# Patient Record
Sex: Male | Born: 1935 | Race: White | Hispanic: No | Marital: Single | Smoking: Former smoker
Health system: Southern US, Community
[De-identification: ages and names within clinical notes are randomized; demographics above are authoritative.]

## PROBLEM LIST (undated history)

## (undated) DIAGNOSIS — N289 Disorder of kidney and ureter, unspecified: Secondary | ICD-10-CM

## (undated) DIAGNOSIS — I10 Essential (primary) hypertension: Secondary | ICD-10-CM

## (undated) DIAGNOSIS — I452 Bifascicular block: Secondary | ICD-10-CM

## (undated) DIAGNOSIS — N189 Chronic kidney disease, unspecified: Secondary | ICD-10-CM

## (undated) DIAGNOSIS — K439 Ventral hernia without obstruction or gangrene: Secondary | ICD-10-CM

## (undated) DIAGNOSIS — K56609 Unspecified intestinal obstruction, unspecified as to partial versus complete obstruction: Secondary | ICD-10-CM

## (undated) DIAGNOSIS — N400 Enlarged prostate without lower urinary tract symptoms: Secondary | ICD-10-CM

## (undated) HISTORY — DX: Disorder of kidney and ureter, unspecified: N28.9

## (undated) HISTORY — DX: Essential (primary) hypertension: I10

## (undated) HISTORY — DX: Chronic kidney disease, unspecified: N18.9

## (undated) HISTORY — PX: APPENDECTOMY: SHX54

## (undated) HISTORY — PX: LAPAROTOMY: SHX154

## (undated) HISTORY — PX: CATARACT EXTRACTION: SUR2

## (undated) HISTORY — DX: Benign prostatic hyperplasia without lower urinary tract symptoms: N40.0

## (undated) HISTORY — DX: Bifascicular block: I45.2

## (undated) HISTORY — DX: Ventral hernia without obstruction or gangrene: K43.9

## (undated) HISTORY — DX: Unspecified intestinal obstruction, unspecified as to partial versus complete obstruction: K56.609

---

## 1998-11-11 ENCOUNTER — Ambulatory Visit (HOSPITAL_COMMUNITY): Admission: RE | Admit: 1998-11-11 | Discharge: 1998-11-11 | Payer: Self-pay | Admitting: *Deleted

## 1998-11-11 ENCOUNTER — Encounter: Payer: Self-pay | Admitting: *Deleted

## 1998-11-25 ENCOUNTER — Encounter: Payer: Self-pay | Admitting: *Deleted

## 1998-11-25 ENCOUNTER — Ambulatory Visit (HOSPITAL_COMMUNITY): Admission: RE | Admit: 1998-11-25 | Discharge: 1998-11-25 | Payer: Self-pay | Admitting: *Deleted

## 1999-01-09 ENCOUNTER — Inpatient Hospital Stay (HOSPITAL_COMMUNITY): Admission: RE | Admit: 1999-01-09 | Discharge: 1999-01-10 | Payer: Self-pay | Admitting: *Deleted

## 2002-05-14 ENCOUNTER — Encounter: Payer: Self-pay | Admitting: *Deleted

## 2002-05-14 ENCOUNTER — Encounter: Admission: RE | Admit: 2002-05-14 | Discharge: 2002-05-14 | Payer: Self-pay | Admitting: *Deleted

## 2002-06-16 ENCOUNTER — Encounter: Admission: RE | Admit: 2002-06-16 | Discharge: 2002-06-16 | Payer: Self-pay | Admitting: *Deleted

## 2002-06-16 ENCOUNTER — Encounter: Payer: Self-pay | Admitting: *Deleted

## 2005-04-03 HISTORY — PX: CARDIOVASCULAR STRESS TEST: SHX262

## 2005-04-06 ENCOUNTER — Encounter: Admission: RE | Admit: 2005-04-06 | Discharge: 2005-04-06 | Payer: Self-pay | Admitting: General Surgery

## 2009-03-07 ENCOUNTER — Encounter: Admission: RE | Admit: 2009-03-07 | Discharge: 2009-03-07 | Payer: Self-pay | Admitting: Cardiology

## 2009-10-10 ENCOUNTER — Ambulatory Visit: Payer: Self-pay | Admitting: Cardiology

## 2009-10-10 ENCOUNTER — Inpatient Hospital Stay (HOSPITAL_COMMUNITY): Admission: EM | Admit: 2009-10-10 | Discharge: 2009-10-13 | Payer: Self-pay | Admitting: Emergency Medicine

## 2009-10-17 ENCOUNTER — Ambulatory Visit: Payer: Self-pay | Admitting: Cardiology

## 2009-11-17 ENCOUNTER — Ambulatory Visit: Payer: Self-pay | Admitting: Cardiology

## 2009-12-19 ENCOUNTER — Ambulatory Visit: Payer: Self-pay | Admitting: Cardiology

## 2010-05-05 LAB — CBC
HCT: 29.3 % — ABNORMAL LOW (ref 39.0–52.0)
Hemoglobin: 9.5 g/dL — ABNORMAL LOW (ref 13.0–17.0)
Hemoglobin: 9.9 g/dL — ABNORMAL LOW (ref 13.0–17.0)
MCH: 31.9 pg (ref 26.0–34.0)
MCH: 32.3 pg (ref 26.0–34.0)
MCHC: 33.5 g/dL (ref 30.0–36.0)
MCV: 95.7 fL (ref 78.0–100.0)
MCV: 96.7 fL (ref 78.0–100.0)
Platelets: 162 10*3/uL (ref 150–400)
Platelets: 206 10*3/uL (ref 150–400)
RBC: 3.05 MIL/uL — ABNORMAL LOW (ref 4.22–5.81)
RBC: 3.06 MIL/uL — ABNORMAL LOW (ref 4.22–5.81)
RDW: 14 % (ref 11.5–15.5)
RDW: 14 % (ref 11.5–15.5)
RDW: 14.3 % (ref 11.5–15.5)
WBC: 6.7 10*3/uL (ref 4.0–10.5)
WBC: 8 10*3/uL (ref 4.0–10.5)

## 2010-05-05 LAB — DIFFERENTIAL
Basophils Absolute: 0 10*3/uL (ref 0.0–0.1)
Basophils Relative: 0 % (ref 0–1)
Eosinophils Absolute: 0 10*3/uL (ref 0.0–0.7)
Lymphocytes Relative: 7 % — ABNORMAL LOW (ref 12–46)
Lymphs Abs: 1 10*3/uL (ref 0.7–4.0)
Neutro Abs: 11.7 10*3/uL — ABNORMAL HIGH (ref 1.7–7.7)
Neutrophils Relative %: 89 % — ABNORMAL HIGH (ref 43–77)

## 2010-05-05 LAB — POCT I-STAT, CHEM 8
BUN: 29 mg/dL — ABNORMAL HIGH (ref 6–23)
Calcium, Ion: 1.12 mmol/L (ref 1.12–1.32)
Chloride: 104 mEq/L (ref 96–112)
Creatinine, Ser: 2.6 mg/dL — ABNORMAL HIGH (ref 0.4–1.5)
Glucose, Bld: 168 mg/dL — ABNORMAL HIGH (ref 70–99)
Potassium: 5.2 mEq/L — ABNORMAL HIGH (ref 3.5–5.1)
TCO2: 28 mmol/L (ref 0–100)

## 2010-05-05 LAB — BASIC METABOLIC PANEL
BUN: 14 mg/dL (ref 6–23)
CO2: 25 mEq/L (ref 19–32)
CO2: 26 mEq/L (ref 19–32)
Calcium: 8.1 mg/dL — ABNORMAL LOW (ref 8.4–10.5)
Calcium: 8.4 mg/dL (ref 8.4–10.5)
GFR calc Af Amer: 35 mL/min — ABNORMAL LOW (ref >60)
GFR calc non Af Amer: 29 mL/min — ABNORMAL LOW (ref >60)
Potassium: 3.7 mEq/L (ref 3.5–5.1)
Potassium: 4.2 mEq/L (ref 3.5–5.1)
Potassium: 4.4 mEq/L (ref 3.5–5.1)
Sodium: 142 mEq/L (ref 135–145)

## 2010-05-05 LAB — IRON AND TIBC
Iron: 28 ug/dL — ABNORMAL LOW (ref 42–135)
Saturation Ratios: 14 % — ABNORMAL LOW (ref 20–55)
TIBC: 206 ug/dL — ABNORMAL LOW (ref 215–435)

## 2010-05-05 LAB — APTT: aPTT: 31 seconds (ref 24–37)

## 2010-05-05 LAB — LIPASE, BLOOD: Lipase: 25 U/L (ref 11–59)

## 2010-05-05 LAB — COMPREHENSIVE METABOLIC PANEL
AST: 28 U/L (ref 0–37)
Albumin: 4.1 g/dL (ref 3.5–5.2)
Alkaline Phosphatase: 50 U/L (ref 39–117)
GFR calc Af Amer: 31 mL/min — ABNORMAL LOW (ref >60)
GFR calc non Af Amer: 25 mL/min — ABNORMAL LOW (ref >60)
Glucose, Bld: 133 mg/dL — ABNORMAL HIGH (ref 70–99)
Sodium: 141 mEq/L (ref 135–145)
Total Bilirubin: 0.8 mg/dL (ref 0.3–1.2)

## 2010-05-05 LAB — RETICULOCYTES: Retic Ct Pct: 1 % (ref 0.4–3.1)

## 2010-05-05 LAB — VITAMIN B12: Vitamin B-12: 118 pg/mL — ABNORMAL LOW (ref 211–911)

## 2010-05-05 LAB — PROTIME-INR: Prothrombin Time: 14.7 seconds (ref 11.6–15.2)

## 2010-05-05 LAB — HEMOCCULT GUIAC POC 1CARD (OFFICE): Fecal Occult Bld: NEGATIVE

## 2010-07-07 NOTE — Op Note (Signed)
[place]  Patient:    Ronald Norris                        MRN: [id number] Proc. Date: 01/09/99 Adm. Date:  [id number] Attending:  Maryanna Shape                           Operative Report  PREOPERATIVE DIAGNOSIS:  Generalized lumbar osteoarthritis with severe stenosis at L4-5, moderate stenosis L3-4.  POSTOPERATIVE DIAGNOSIS:  Generalized lumbar osteoarthritis with severe stenosis at L4-5, moderate stenosis L3-4.  OPERATION:  Decompression of L3-4 and L4-5 exposing the L3 and the L4 nerve roots bilaterally.  SURGEON:  Reynolds Bowl, M.D.  ASSISTANT:  Humberto Leep. Wyonia Hough, M.D.  ANESTHESIA:  General.  DESCRIPTION OF PROCEDURE:  The patient was given a general anesthetic and 1 gm f Ancef was given IV.  He was placed on the operative table using the [facility] frame, prepped and draped in the usual manner.  We identified the L4-5 interspace with  needle markings and x-ray.  A midline incision was made.  The paraspinal muscles were dissected and retracted laterally.  Self-retaining retractor was put in place. Then I removed the posterior spine of L4 and the remainder of the L4 lamina, as  well as the ligamentum flavum at L3-4 and the ligamentum flavum at L4-5.  Then removed in several passes right and left laterally.  Removed the very tight overhanging stenotic walls out to the sidewall.  We exposed the L3 nerve root coming around the pedicle of L3 and at that point also did foraminotomy. Identified the L4 nerve root going around the L4 pedicle and did a foraminotomy. In order to do all of this laterally, this required removal of very thick ligamentum flavum and thick, compressive lateral gutters.  This same procedure as done on the left and the right and at the completion it was felt the nerve roots, foramina, and lateral gutters were free.  The nerve roots were no longer under compression.  The disk spaces had prominent annulus  but no evidence of herniation. Therefore, the annulus was not opened.  Having completed all of this, the area was irrigated several times.  The microscope was removed and the lumbar fascia reapproximated  using figure-of-eight sutures, #1 Vicryl, then 2-0 Vicryl for the subcutaneous tissues, metal staples for the skin, and an air pad Band-Aid type dressing was applied.  The patient returned to the recovery room in good condition. DD:  01/09/99 TD:  01/09/99 Job: [id number]

## 2010-08-16 ENCOUNTER — Other Ambulatory Visit: Payer: Self-pay | Admitting: *Deleted

## 2010-08-17 MED ORDER — METOPROLOL SUCCINATE ER 25 MG PO TB24: 25.0000 mg | ORAL_TABLET | Freq: Two times a day (BID) | ORAL | Status: DC

## 2010-08-17 NOTE — Telephone Encounter (Signed)
Refilled meds per fax request. Spoke with wife and patient says he is taking toprol xl 25 mg twice a day

## 2010-10-06 ENCOUNTER — Other Ambulatory Visit: Payer: Self-pay | Admitting: *Deleted

## 2010-10-06 DIAGNOSIS — I10 Essential (primary) hypertension: Secondary | ICD-10-CM

## 2010-11-10 ENCOUNTER — Encounter: Payer: Self-pay | Admitting: Cardiology

## 2010-11-23 ENCOUNTER — Ambulatory Visit: Payer: Self-pay | Admitting: Cardiology

## 2010-11-23 ENCOUNTER — Other Ambulatory Visit (INDEPENDENT_AMBULATORY_CARE_PROVIDER_SITE_OTHER): Payer: Medicare Other | Admitting: *Deleted

## 2010-11-23 ENCOUNTER — Ambulatory Visit (INDEPENDENT_AMBULATORY_CARE_PROVIDER_SITE_OTHER): Payer: Medicare Other | Admitting: Cardiology

## 2010-11-23 ENCOUNTER — Encounter: Payer: Self-pay | Admitting: Cardiology

## 2010-11-23 ENCOUNTER — Other Ambulatory Visit: Payer: Self-pay | Admitting: *Deleted

## 2010-11-23 VITALS — BP 120/68 | HR 54 | Ht 67.0 in | Wt 156.0 lb

## 2010-11-23 DIAGNOSIS — N401 Enlarged prostate with lower urinary tract symptoms: Secondary | ICD-10-CM

## 2010-11-23 DIAGNOSIS — I119 Hypertensive heart disease without heart failure: Secondary | ICD-10-CM | POA: Insufficient documentation

## 2010-11-23 DIAGNOSIS — K5900 Constipation, unspecified: Secondary | ICD-10-CM | POA: Insufficient documentation

## 2010-11-23 DIAGNOSIS — H3530 Unspecified macular degeneration: Secondary | ICD-10-CM

## 2010-11-23 DIAGNOSIS — N289 Disorder of kidney and ureter, unspecified: Secondary | ICD-10-CM

## 2010-11-23 DIAGNOSIS — I10 Essential (primary) hypertension: Secondary | ICD-10-CM

## 2010-11-23 DIAGNOSIS — D649 Anemia, unspecified: Secondary | ICD-10-CM | POA: Insufficient documentation

## 2010-11-23 LAB — LIPID PANEL: Cholesterol: 181 mg/dL (ref 0–200)

## 2010-11-23 LAB — HEPATIC FUNCTION PANEL
ALT: 11 U/L (ref 0–53)
Alkaline Phosphatase: 47 U/L (ref 39–117)
Bilirubin, Direct: 0 mg/dL (ref 0.0–0.3)
Total Protein: 7.5 g/dL (ref 6.0–8.3)

## 2010-11-23 LAB — CBC WITH DIFFERENTIAL/PLATELET
Basophils Absolute: 0 10*3/uL (ref 0.0–0.1)
Eosinophils Absolute: 0.1 10*3/uL (ref 0.0–0.7)
Eosinophils Relative: 1.2 % (ref 0.0–5.0)
MCV: 97.4 fl (ref 78.0–100.0)
Monocytes Absolute: 0.6 10*3/uL (ref 0.1–1.0)
Neutrophils Relative %: 66.7 % (ref 43.0–77.0)
Platelets: 206 10*3/uL (ref 150.0–400.0)
RDW: 14.5 % (ref 11.5–14.6)
WBC: 7.3 10*3/uL (ref 4.5–10.5)

## 2010-11-23 LAB — BASIC METABOLIC PANEL
BUN: 32 mg/dL — ABNORMAL HIGH (ref 6–23)
Chloride: 108 mEq/L (ref 96–112)
Creatinine, Ser: 2.5 mg/dL — ABNORMAL HIGH (ref 0.4–1.5)
GFR: 25.68 mL/min — ABNORMAL LOW (ref >60.00)

## 2010-11-23 NOTE — Patient Instructions (Signed)
Your physician wants you to follow-up in: 1 year You will receive a reminder letter in the mail two months in advance. If you don't receive a letter, please call our office to schedule the follow-up appointment.  continue same dose of medications

## 2010-11-23 NOTE — Assessment & Plan Note (Signed)
The patient has been having some symptoms of mild constipation.  He's had no hematochezia or melena.  Is not having any abdominal pain.  I've encouraged him to add Metamucil to his regimen and to drink plenty of water

## 2010-11-23 NOTE — Assessment & Plan Note (Signed)
Patient has a history of BPH with obstructive symptoms.  He also has a history of renal insufficiency.  He is not interested in any surgery for his BPH at this point.

## 2010-11-23 NOTE — Progress Notes (Signed)
Ronald Norris Date of Birth:  1914/10/12 [name] / [facility] [address]   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  In very good health.  He does have a history of hypertension.  He also has a history of previous partial small bowel obstruction requiring surgery.  He also has a history of chronic renal insufficiency and a history of known bifascicular block with right bundle branch block and left anterior hemiblock.  He also has a history of BPH.  Current Outpatient Prescriptions  Medication Sig Dispense Refill  . aspirin 81 MG tablet Take 81 mg by mouth daily.        . metoprolol succinate (TOPROL XL) 25 MG 24 hr tablet Take 1 tablet (25 mg total) by mouth 2 (two) times daily.  60 tablet  11  . Multiple Vitamin (MULTIVITAMIN) tablet Take 1 tablet by mouth daily.        . Tetrahydrozoline HCl (EYE DROPS OP) Apply to eye daily.          No Known Allergies  Patient Active Problem List  Diagnoses  . Benign hypertensive heart disease without heart failure  . Macular degeneration  . Constipation  . Renal insufficiency  . Anemia    History  Smoking status  . Former Smoker  Smokeless tobacco  . Not on file    History  Alcohol Use No    Family History  Problem Relation Age of Onset  . Stroke Mother   . Pancreatic cancer Father     Review of Systems: Constitutional: no fever chills diaphoresis or fatigue or change in weight.  Head and neck: no hearing loss, no epistaxis, no photophobia or visual disturbance. Respiratory: No cough, shortness of breath or wheezing. Cardiovascular: No chest pain peripheral edema, palpitations. Gastrointestinal: No abdominal distention, no abdominal pain, no change in bowel habits hematochezia or melena. Genitourinary: No dysuria, no frequency, no urgency, no  nocturia. Musculoskeletal:No arthralgias, no back pain, no gait disturbance or myalgias. Neurological: No dizziness, no headaches, no numbness, no seizures, no syncope, no weakness, no tremors. Hematologic: No lymphadenopathy, no easy bruising. Psychiatric: No confusion, no hallucinations, no sleep disturbance.    Physical Exam: Filed Vitals:   11/23/10 0848  BP: 120/68  Pulse: 54   the general appearance reveals an elderly gentleman in no acute distress.Pupils equal and reactive.   Extraocular Movements are full.  There is no scleral icterus.  The mouth and pharynx are normal.  The neck is supple.  The carotids reveal no bruits.  The jugular venous pressure is normal.  The thyroid is not enlarged.  There is no lymphadenopathy.  The chest is clear to percussion and auscultation. There are no rales or rhonchi. Expansion of the chest is symmetrical.  The precordium is quiet.  The first heart sound is normal.  The second heart sound is physiologically split.  There is no murmur gallop rub or click.  There is no abnormal lift or heave.  The abdomen is soft and nontender. Bowel sounds are normal. The liver and spleen are not enlarged. There Are no abdominal masses. There are no bruits.  The pedal pulses are good.  There is no phlebitis or edema.  There is no cyanosis or clubbing. Strength is normal and symmetrical in all extremities.  There is  no lateralizing weakness.  There are no sensory deficits.     Assessment / Plan: Continue same medication.  Recheck in one year for a followup office visit and EKG and fasting lab work and a CBC

## 2010-11-23 NOTE — Assessment & Plan Note (Signed)
The patient has a history of chronic anemia, felt secondary to chronic disease, including his chronic renal insufficiency.  We are checking a CBC today.

## 2010-11-23 NOTE — Assessment & Plan Note (Signed)
The patient has not been having increasing symptoms from his blood pressure.  He denies any chest pain or shortness of breath.  He's had no palpitations.  He continues to play golf 18 holes.  Several days a week.

## 2010-11-30 ENCOUNTER — Ambulatory Visit (INDEPENDENT_AMBULATORY_CARE_PROVIDER_SITE_OTHER): Payer: Medicare Other | Admitting: Ophthalmology

## 2010-11-30 DIAGNOSIS — H43819 Vitreous degeneration, unspecified eye: Secondary | ICD-10-CM

## 2010-11-30 DIAGNOSIS — D3130 Benign neoplasm of unspecified choroid: Secondary | ICD-10-CM

## 2010-11-30 DIAGNOSIS — H3530 Unspecified macular degeneration: Secondary | ICD-10-CM

## 2010-11-30 DIAGNOSIS — H18519 Endothelial corneal dystrophy, unspecified eye: Secondary | ICD-10-CM

## 2010-11-30 NOTE — Progress Notes (Signed)
No answer

## 2010-12-01 ENCOUNTER — Telehealth: Payer: Self-pay | Admitting: *Deleted

## 2010-12-01 NOTE — Telephone Encounter (Signed)
Message copied by Burnell Blanks on Fri Dec 01, 2010  8:48 AM ------      Message from: Cassell Clement      Created: Thu Nov 23, 2010  3:38 PM       Please report. Cholesterol 181 good.  LDL mild elevation. LFTs nl.  Kidney function not as good with Cr up to 2.5.  Drink more water.  His BPH may also be contributing factor.  Does he wish a referral to a urologist?  I think he may already have one?            Hgb 11.6 better.

## 2010-12-01 NOTE — Progress Notes (Signed)
Advised wife.  Patient has urologist and will call back next week with urologist to forward to

## 2010-12-01 NOTE — Telephone Encounter (Signed)
Advised wife.  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

## 2011-02-22 ENCOUNTER — Telehealth: Payer: Self-pay | Admitting: Cardiology

## 2011-02-22 NOTE — Telephone Encounter (Signed)
New Msg: Pt wife calling stating that pt is having trouble with his leg (left leg-behind the knee); pt thinks he has a blocked artery. Pt would like to speak to MD and/or nurse. Please return pt call to discuss further.

## 2011-02-22 NOTE — Telephone Encounter (Signed)
Discussed with  Dr. Patty Sermons and will come for office visit tomorrow

## 2011-02-23 ENCOUNTER — Encounter: Payer: Self-pay | Admitting: Cardiology

## 2011-02-23 ENCOUNTER — Ambulatory Visit (INDEPENDENT_AMBULATORY_CARE_PROVIDER_SITE_OTHER): Payer: Medicare Other | Admitting: Cardiology

## 2011-02-23 VITALS — BP 138/70 | HR 54 | Ht 68.0 in | Wt 156.0 lb

## 2011-02-23 DIAGNOSIS — N289 Disorder of kidney and ureter, unspecified: Secondary | ICD-10-CM

## 2011-02-23 DIAGNOSIS — I119 Hypertensive heart disease without heart failure: Secondary | ICD-10-CM

## 2011-02-23 DIAGNOSIS — M1990 Unspecified osteoarthritis, unspecified site: Secondary | ICD-10-CM

## 2011-02-23 NOTE — Assessment & Plan Note (Signed)
The patient has been experiencing a sensation that his left knee he suddenly giving away.  He is concerned that he is going to fall and hurt himself.  He has not been having any claudication.  He was concerned that he might have a blocked artery in his leg.

## 2011-02-23 NOTE — Patient Instructions (Signed)
Have scheduled an appointment with Dr Darrelyn Hillock on 03/09/2011 arrive at 8:00 for 8:15 am [phone]

## 2011-02-23 NOTE — Assessment & Plan Note (Signed)
The patient has not had reason symptoms or his blood pressure.  He denies any dizziness or syncope.

## 2011-02-23 NOTE — Assessment & Plan Note (Signed)
The patient has not been expressing any problem from his renal insufficiency.

## 2011-02-23 NOTE — Progress Notes (Signed)
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  He has a past history of essential hypertension.  Is at a remote history of partial small bowel obstruction requiring surgery.  He had a history of chronic renal insufficiency.  He said BPH.  He has a known bifascicular block on EKG.  He has been in good general health.  He still plays golf and is able to shoot his age easily.  He comes in today because of concern over his left knee suddenly giving way.  Current Outpatient Prescriptions  Medication Sig Dispense Refill  . aspirin 81 MG tablet Take 81 mg by mouth 2 (two) times daily.       . metoprolol succinate (TOPROL XL) 25 MG 24 hr tablet Take 1 tablet (25 mg total) by mouth 2 (two) times daily.  60 tablet  11  . Multiple Vitamin (MULTIVITAMIN) tablet Take 1 tablet by mouth daily.        . Tetrahydrozoline HCl (EYE DROPS OP) Apply to eye daily.          No Known Allergies  Patient Active Problem List  Diagnoses  . Benign hypertensive heart disease without heart failure  . Macular degeneration  . Constipation  . Renal insufficiency  . Anemia  . BPH with obstruction/lower urinary tract symptoms    History  Smoking status  . Former Smoker  Smokeless tobacco  . Not on file    History  Alcohol Use No    Family History  Problem Relation Age of Onset  . Stroke Mother   . Pancreatic cancer Father     Review of Systems: Constitutional: no fever chills diaphoresis or fatigue or change in weight.  Head and neck: no hearing loss, no epistaxis, no photophobia or visual disturbance. Respiratory: No cough, shortness of breath or wheezing. Cardiovascular: No chest pain peripheral edema, palpitations. Gastrointestinal: No abdominal distention, no abdominal pain, no  change in bowel habits hematochezia or melena. Genitourinary: No dysuria, no frequency, no urgency, no nocturia. Musculoskeletal:No arthralgias, no back pain, no gait disturbance or myalgias. Neurological: No dizziness, no headaches, no numbness, no seizures, no syncope, no weakness, no tremors. Hematologic: No lymphadenopathy, no easy bruising. Psychiatric: No confusion, no hallucinations, no sleep disturbance.    Physical Exam: Filed Vitals:   02/23/11 1436  BP: 138/70  Pulse: 54   the general appearance reveals an alert healthy-appearing elderly gentleman in no distress.Pupils equal and reactive.   Extraocular Movements are full.  There is no scleral icterus.  The mouth and pharynx are normal.  The neck is supple.  The carotids reveal no bruits.  The jugular venous pressure is normal.  The thyroid is not enlarged.  There is no lymphadenopathy.  The chest is clear to percussion and auscultation. There are no rales or rhonchi. Expansion of the chest is symmetrical.  The heart reveals a grade 1/6 systolic ejection murmur at the base.  The abdomen is soft and nontender extremities reveal 1+ dorsalis pedis pulses bilaterally.  He has a very strong 3+ popliteal pulses bilaterally.  He has some question bilateral thickening behind the left knee possibly a Bakers cyst.  Seems to have good mobility and motion of both knees. Strength is normal and symmetrical in all  extremities.  There is no lateralizing weakness.  There are no sensory deficits.  The skin is warm and dry.  There is no rash.    Assessment / Plan: The etiology of his sensation of his left knee suddenly giving away is not clear to me.  I want him to see Dr. Darrelyn Hillock for orthopedic evaluation.  Continue same chronic medications and recheck here at his regular visit, or sooner when necessary

## 2011-10-12 ENCOUNTER — Other Ambulatory Visit: Payer: Self-pay | Admitting: Cardiology

## 2011-10-12 NOTE — Telephone Encounter (Signed)
Refilled metoprolol 

## 2011-12-05 ENCOUNTER — Encounter (INDEPENDENT_AMBULATORY_CARE_PROVIDER_SITE_OTHER): Payer: Medicare Other | Admitting: Ophthalmology

## 2011-12-05 DIAGNOSIS — I10 Essential (primary) hypertension: Secondary | ICD-10-CM

## 2011-12-05 DIAGNOSIS — H43819 Vitreous degeneration, unspecified eye: Secondary | ICD-10-CM

## 2011-12-05 DIAGNOSIS — D3130 Benign neoplasm of unspecified choroid: Secondary | ICD-10-CM

## 2011-12-05 DIAGNOSIS — H35039 Hypertensive retinopathy, unspecified eye: Secondary | ICD-10-CM

## 2011-12-05 DIAGNOSIS — H3530 Unspecified macular degeneration: Secondary | ICD-10-CM

## 2012-12-03 ENCOUNTER — Other Ambulatory Visit: Payer: Self-pay | Admitting: Cardiology

## 2013-01-19 ENCOUNTER — Ambulatory Visit (INDEPENDENT_AMBULATORY_CARE_PROVIDER_SITE_OTHER): Payer: Medicare Other | Admitting: Ophthalmology

## 2013-01-19 DIAGNOSIS — H3530 Unspecified macular degeneration: Secondary | ICD-10-CM

## 2013-01-19 DIAGNOSIS — D3130 Benign neoplasm of unspecified choroid: Secondary | ICD-10-CM

## 2013-01-19 DIAGNOSIS — H35039 Hypertensive retinopathy, unspecified eye: Secondary | ICD-10-CM

## 2013-01-19 DIAGNOSIS — H43819 Vitreous degeneration, unspecified eye: Secondary | ICD-10-CM

## 2013-01-19 DIAGNOSIS — I10 Essential (primary) hypertension: Secondary | ICD-10-CM

## 2013-02-17 ENCOUNTER — Other Ambulatory Visit: Payer: Self-pay | Admitting: Cardiology

## 2013-05-08 ENCOUNTER — Other Ambulatory Visit: Payer: Self-pay | Admitting: Cardiology

## 2013-05-08 NOTE — Telephone Encounter (Signed)
Is this ok? Has not been seen in over 2 yrs. Thanks, MI

## 2013-05-13 ENCOUNTER — Other Ambulatory Visit: Payer: Self-pay

## 2013-05-13 MED ORDER — METOPROLOL SUCCINATE ER 25 MG PO TB24: ORAL_TABLET | ORAL | Status: DC

## 2013-06-10 ENCOUNTER — Emergency Department (HOSPITAL_COMMUNITY): Payer: Medicare Other

## 2013-06-10 ENCOUNTER — Emergency Department (HOSPITAL_COMMUNITY)
Admission: EM | Admit: 2013-06-10 | Discharge: 2013-06-10 | Disposition: A | Discharge status: Home / Self Care | Payer: Medicare Other | Attending: Emergency Medicine | Admitting: Emergency Medicine

## 2013-06-10 ENCOUNTER — Encounter (HOSPITAL_COMMUNITY): Payer: Self-pay | Admitting: Emergency Medicine

## 2013-06-10 DIAGNOSIS — Z7982 Long term (current) use of aspirin: Secondary | ICD-10-CM | POA: Insufficient documentation

## 2013-06-10 DIAGNOSIS — I129 Hypertensive chronic kidney disease with stage 1 through stage 4 chronic kidney disease, or unspecified chronic kidney disease: Secondary | ICD-10-CM | POA: Insufficient documentation

## 2013-06-10 DIAGNOSIS — J029 Acute pharyngitis, unspecified: Secondary | ICD-10-CM | POA: Insufficient documentation

## 2013-06-10 DIAGNOSIS — Z87891 Personal history of nicotine dependence: Secondary | ICD-10-CM | POA: Insufficient documentation

## 2013-06-10 DIAGNOSIS — H9209 Otalgia, unspecified ear: Secondary | ICD-10-CM

## 2013-06-10 DIAGNOSIS — Z79899 Other long term (current) drug therapy: Secondary | ICD-10-CM | POA: Insufficient documentation

## 2013-06-10 DIAGNOSIS — Z8719 Personal history of other diseases of the digestive system: Secondary | ICD-10-CM | POA: Insufficient documentation

## 2013-06-10 DIAGNOSIS — R059 Cough, unspecified: Secondary | ICD-10-CM

## 2013-06-10 DIAGNOSIS — N189 Chronic kidney disease, unspecified: Secondary | ICD-10-CM | POA: Insufficient documentation

## 2013-06-10 DIAGNOSIS — Z87448 Personal history of other diseases of urinary system: Secondary | ICD-10-CM | POA: Insufficient documentation

## 2013-06-10 DIAGNOSIS — R05 Cough: Secondary | ICD-10-CM

## 2013-06-10 LAB — URINALYSIS, ROUTINE W REFLEX MICROSCOPIC
Bilirubin Urine: NEGATIVE
Glucose, UA: NEGATIVE mg/dL
KETONES UR: NEGATIVE mg/dL
Leukocytes, UA: NEGATIVE
NITRITE: NEGATIVE
PROTEIN: NEGATIVE mg/dL
Specific Gravity, Urine: 1.018 (ref 1.005–1.030)
Urobilinogen, UA: 0.2 mg/dL (ref 0.0–1.0)
pH: 5.5 (ref 5.0–8.0)

## 2013-06-10 LAB — CBC WITH DIFFERENTIAL/PLATELET
BASOS ABS: 0 10*3/uL (ref 0.0–0.1)
Basophils Relative: 0 % (ref 0–1)
EOS ABS: 0 10*3/uL (ref 0.0–0.7)
Eosinophils Relative: 0 % (ref 0–5)
HEMATOCRIT: 32.3 % — AB (ref 39.0–52.0)
Hemoglobin: 10.6 g/dL — ABNORMAL LOW (ref 13.0–17.0)
LYMPHS ABS: 1 10*3/uL (ref 0.7–4.0)
Lymphocytes Relative: 6 % — ABNORMAL LOW (ref 12–46)
MCH: 32 pg (ref 26.0–34.0)
MCHC: 32.8 g/dL (ref 30.0–36.0)
MCV: 97.6 fL (ref 78.0–100.0)
MONO ABS: 1.5 10*3/uL — AB (ref 0.1–1.0)
Monocytes Relative: 9 % (ref 3–12)
Neutro Abs: 14.5 10*3/uL — ABNORMAL HIGH (ref 1.7–7.7)
Neutrophils Relative %: 85 % — ABNORMAL HIGH (ref 43–77)
PLATELETS: 208 10*3/uL (ref 150–400)
RBC: 3.31 MIL/uL — ABNORMAL LOW (ref 4.22–5.81)
RDW: 13.7 % (ref 11.5–15.5)
WBC: 17 10*3/uL — ABNORMAL HIGH (ref 4.0–10.5)

## 2013-06-10 LAB — BASIC METABOLIC PANEL
BUN: 38 mg/dL — AB (ref 6–23)
CALCIUM: 9.3 mg/dL (ref 8.4–10.5)
CO2: 21 mEq/L (ref 19–32)
CREATININE: 2.46 mg/dL — AB (ref 0.50–1.35)
Chloride: 103 mEq/L (ref 96–112)
GFR calc Af Amer: 24 mL/min — ABNORMAL LOW (ref >90)
GFR calc non Af Amer: 21 mL/min — ABNORMAL LOW (ref >90)
Glucose, Bld: 144 mg/dL — ABNORMAL HIGH (ref 70–99)
Potassium: 4.6 mEq/L (ref 3.7–5.3)
Sodium: 138 mEq/L (ref 137–147)

## 2013-06-10 LAB — I-STAT TROPONIN, ED: Troponin i, poc: 0.08 ng/mL (ref 0.00–0.08)

## 2013-06-10 LAB — URINE MICROSCOPIC-ADD ON

## 2013-06-10 MED ORDER — ACETAMINOPHEN 325 MG PO TABS
650.0000 mg | ORAL_TABLET | Freq: Once | ORAL | Status: AC
Start: 2013-06-10 — End: 2013-06-10
  Administered 2013-06-10: 650 mg via ORAL
  Filled 2013-06-10: qty 2

## 2013-06-10 MED ORDER — FLUTICASONE PROPIONATE 50 MCG/ACT NA SUSP: 2.0000 | Freq: Every day | NASAL | Status: DC

## 2013-06-10 MED ORDER — AZITHROMYCIN 250 MG PO TABS: 250.0000 mg | ORAL_TABLET | Freq: Every day | ORAL | Status: DC

## 2013-06-10 NOTE — ED Provider Notes (Signed)
TIME SEEN: 8:47 AM  CHIEF COMPLAINT: Sore throat, ear pain, coughing, feels weak  HPI: Patient is a 80 year old male with a history of BPH, hypertension, CKD, bifascicular block who presents to the emergency department with one to 2 weeks of bilateral ear pain, sore throat, nasal congestion and cough. Patient reports that he is feeling fatigued and weak. Denies any fevers, chills, chest pain or new shortness of breath, vomiting or diarrhea. Wife reports he has chronic abdominal pain from his hernias and chronic shortness of breath. He is also had difficulty urinating but this has been present for several years and is due to his BPH. No headache, neck pain or neck stiffness, numbness or tingling, focal weakness.  ROS: See HPI Constitutional: no fever  Eyes: no drainage  ENT:  runny nose   Cardiovascular:  no chest pain  Resp: chronic SOB  GI: no vomiting GU: no dysuria Integumentary: no rash  Allergy: no hives  Musculoskeletal: no leg swelling  Neurological: no slurred speech ROS otherwise negative  PAST MEDICAL HISTORY/PAST SURGICAL HISTORY:  Past Medical History  Diagnosis Date  . BPH (benign prostatic hypertrophy)   . SBO (small bowel obstruction)     PARTIAL  . Ventral hernia   . Systolic hypertension     CHRONIC  . Chronic renal insufficiency   . Bifascicular block   . RBBB plus LA hemiblock     MEDICATIONS:  Prior to Admission medications   Medication Sig Start Date End Date Taking? Authorizing Provider  aspirin 81 MG tablet Take 81 mg by mouth 2 (two) times daily.     Historical Provider, MD  latanoprost (XALATAN) 0.005 % ophthalmic solution  05/18/13   Historical Provider, MD  Tetrahydrozoline HCl (EYE DROPS OP) Apply to eye daily.      Historical Provider, MD    ALLERGIES:  No Known Allergies  SOCIAL HISTORY:  History  Substance Use Topics  . Smoking status: Former Research scientist (life sciences)  . Smokeless tobacco: Not on file  . Alcohol Use: No    FAMILY HISTORY: Family  History  Problem Relation Age of Onset  . Stroke Mother   . Pancreatic cancer Father     EXAM: BP 173/52  Pulse 83  Temp(Src) 98 F (36.7 C) (Oral)  Resp 16  SpO2 99% CONSTITUTIONAL: Alert and oriented and responds appropriately to questions. Well-appearing; well-nourished, elderly, pleasant, nontoxic HEAD: Normocephalic EYES: Conjunctivae clear, PERRL ENT: normal nose; no rhinorrhea; moist mucous membranes; patient is status post tonsillectomy, mild posterior oropharynx erythema with no lesions, no uvular deviation, no trismus or drooling, no dental caries or obvious abscess, patient does have some clear nasal drainage in his posterior oropharynx without cobblestoning, TMs are clear bilaterally NECK: Supple, no meningismus, no LAD  CARD: RRR; S1 and S2 appreciated; no murmurs, no clicks, no rubs, no gallops RESP: Normal chest excursion without splinting or tachypnea; breath sounds clear and equal bilaterally; no wheezes, no rhonchi, no rales,  ABD/GI: Normal bowel sounds; non-distended; soft, non-tender, no rebound, no guarding, multiple ventral hernias are easily reducible and nontender BACK:  The back appears normal and is non-tender to palpation, there is no CVA tenderness EXT: Normal ROM in all joints; non-tender to palpation; no edema; normal capillary refill; no cyanosis    SKIN: Normal color for age and race; warm NEURO: Moves all extremities equally, sensation to light touch intact diffusely, greatest in contact PSYCH: The patient's mood and manner are appropriate. Grooming and personal hygiene are appropriate.  MEDICAL DECISION MAKING: Patient  here with likely viral illness versus sinusitis versus pneumonia. He is complaining of generalized weakness and fatigue. Given his age and comorbidities, we'll check basic labs, EKG and troponin, urine. Anticipate if workup is negative, patient can be discharged home with prescription for amoxicillin and Flonase.  ED PROGRESS: Patient has  a leukocytosis. His creatinine is elevated but this is his baseline. Urine shows small hemoglobin but this is likely from his catheterization. No other sign of infection. Cardiac labs are negative. Patient does have chronic appearing coarse interstitial opacities at the lung apices that may represent fibrosis versus other chronic etiologies, less likely pneumonia. Radiology has recommended high resolution chest CT but I feel this can be done as an outpatient as the appearance is chronic. Have discussed these results with patient and his wife and have recommended that he followup with his physician for outpatient CT. Patient is also requesting surgery and urology followup information for his hernias and BPH. We'll discharge with prescription for Flonase and azithromycin. Have discussed strict return precautions. Patient and family are comfortable with plan to be discharged home.     Date: 06/10/2013 9:14 AM  Rate: 75  Rhythm: normal sinus rhythm  QRS Axis: normal, LVH  Intervals: Bifascicular block, first degree AV block  ST/T Wave abnormalities: normal  Conduction Disutrbances: none  Narrative Interpretation: Prolonged PR interval, bifascicular block, LVH; no change compared to prior EKG of August 2011     [place], DO 06/10/13 1030

## 2013-06-10 NOTE — Discharge Instructions (Signed)
Allergies °Allergies may happen from anything your body is sensitive to. This may be food, medicines, pollens, chemicals, and nearly anything around you in everyday life that produces allergens. An allergen is anything that causes an allergy producing substance. Heredity is often a factor in causing these problems. This means you may have some of the same allergies as your parents. °Food allergies happen in all age groups. Food allergies are some of the most severe and life threatening. Some common food allergies are cow's milk, seafood, eggs, nuts, wheat, and soybeans. °SYMPTOMS  °· Swelling around the mouth. °· An itchy red rash or hives. °· Vomiting or diarrhea. °· Difficulty breathing. °SEVERE ALLERGIC REACTIONS ARE LIFE-THREATENING. °This reaction is called anaphylaxis. It can cause the mouth and throat to swell and cause difficulty with breathing and swallowing. In severe reactions only a trace amount of food (for example, peanut oil in a salad) may cause death within seconds. °Seasonal allergies occur in all age groups. These are seasonal because they usually occur during the same season every year. They may be a reaction to molds, grass pollens, or tree pollens. Other causes of problems are house dust mite allergens, pet dander, and mold spores. The symptoms often consist of nasal congestion, a runny itchy nose associated with sneezing, and tearing itchy eyes. There is often an associated itching of the mouth and ears. The problems happen when you come in contact with pollens and other allergens. Allergens are the particles in the air that the body reacts to with an allergic reaction. This causes you to release allergic antibodies. Through a chain of events, these eventually cause you to release histamine into the blood stream. Although it is meant to be protective to the body, it is this release that causes your discomfort. This is why you were given anti-histamines to feel better.  If you are unable to  pinpoint the offending allergen, it may be determined by skin or blood testing. Allergies cannot be cured but can be controlled with medicine. °Hay fever is a collection of all or some of the seasonal allergy problems. It may often be treated with simple over-the-counter medicine such as diphenhydramine. Take medicine as directed. Do not drink alcohol or drive while taking this medicine. Check with your caregiver or package insert for child dosages. °If these medicines are not effective, there are many new medicines your caregiver can prescribe. Stronger medicine such as nasal spray, eye drops, and corticosteroids may be used if the first things you try do not work well. Other treatments such as immunotherapy or desensitizing injections can be used if all else fails. Follow up with your caregiver if problems continue. These seasonal allergies are usually not life threatening. They are generally more of a nuisance that can often be handled using medicine. °HOME CARE INSTRUCTIONS  °· If unsure what causes a reaction, keep a diary of foods eaten and symptoms that follow. Avoid foods that cause reactions. °· If hives or rash are present: °· Take medicine as directed. °· You may use an over-the-counter antihistamine (diphenhydramine) for hives and itching as needed. °· Apply cold compresses (cloths) to the skin or take baths in cool water. Avoid hot baths or showers. Heat will make a rash and itching worse. °· If you are severely allergic: °· Following a treatment for a severe reaction, hospitalization is often required for closer follow-up. °· Wear a medic-alert bracelet or necklace stating the allergy. °· You and your family must learn how to give adrenaline or use   an anaphylaxis kit.  If you have had a severe reaction, always carry your anaphylaxis kit or EpiPen with you. Use this medicine as directed by your caregiver if a severe reaction is occurring. Failure to do so could have a fatal outcome. SEEK MEDICAL  CARE IF:  You suspect a food allergy. Symptoms generally happen within 30 minutes of eating a food.  Your symptoms have not gone away within 2 days or are getting worse.  You develop new symptoms.  You want to retest yourself or your child with a food or drink you think causes an allergic reaction. Never do this if an anaphylactic reaction to that food or drink has happened before. Only do this under the care of a caregiver. SEEK IMMEDIATE MEDICAL CARE IF:   You have difficulty breathing, are wheezing, or have a tight feeling in your chest or throat.  You have a swollen mouth, or you have hives, swelling, or itching all over your body.  You have had a severe reaction that has responded to your anaphylaxis kit or an EpiPen. These reactions may return when the medicine has worn off. These reactions should be considered life threatening. MAKE SURE YOU:   Understand these instructions.  Will watch your condition.  Will get help right away if you are not doing well or get worse. Document Released: 05/01/2002 Document Revised: 06/02/2012 Document Reviewed: 10/06/2007 [facility] Patient Information 2014 [place]. Otalgia The most common reason for this in children is an infection of the middle ear. Pain from the middle ear is usually caused by a build-up of fluid and pressure behind the eardrum. Pain from an earache can be sharp, dull, or burning. The pain may be temporary or constant. The middle ear is connected to the nasal passages by a short narrow tube called the Eustachian tube. The Eustachian tube allows fluid to drain out of the middle ear, and helps keep the pressure in your ear equalized. CAUSES  A cold or allergy can block the Eustachian tube with inflammation and the build-up of secretions. This is especially likely in small children, because their Eustachian tube is shorter and more horizontal. When the Eustachian tube closes, the normal flow of fluid from the middle ear is  stopped. Fluid can accumulate and cause stuffiness, pain, hearing loss, and an ear infection if germs start growing in this area. SYMPTOMS  The symptoms of an ear infection may include fever, ear pain, fussiness, increased crying, and irritability. Many children will have temporary and minor hearing loss during and right after an ear infection. Permanent hearing loss is rare, but the risk increases the more infections a child has. Other causes of ear pain include retained water in the outer ear canal from swimming and bathing. Ear pain in adults is less likely to be from an ear infection. Ear pain may be referred from other locations. Referred pain may be from the joint between your jaw and the skull. It may also come from a tooth problem or problems in the neck. Other causes of ear pain include:  A foreign body in the ear.  Outer ear infection.  Sinus infections.  Impacted ear wax.  Ear injury.  Arthritis of the jaw or TMJ problems.  Middle ear infection.  Tooth infections.  Sore throat with pain to the ears. DIAGNOSIS  Your caregiver can usually make the diagnosis by examining you. Sometimes other special studies, including x-rays and lab work may be necessary. TREATMENT   If antibiotics were prescribed, use  them as directed and finish them even if you or your child's symptoms seem to be improved.  Sometimes PE tubes are needed in children. These are little plastic tubes which are put into the eardrum during a simple surgical procedure. They allow fluid to drain easier and allow the pressure in the middle ear to equalize. This helps relieve the ear pain caused by pressure changes. HOME CARE INSTRUCTIONS   Only take over-the-counter or prescription medicines for pain, discomfort, or fever as directed by your caregiver. DO NOT GIVE CHILDREN ASPIRIN because of the association of Reye's Syndrome in children taking aspirin.  Use a cold pack applied to the outer ear for 15-20 minutes,  03-04 times per day or as needed may reduce pain. Do not apply ice directly to the skin. You may cause frost bite.  Over-the-counter ear drops used as directed may be effective. Your caregiver may sometimes prescribe ear drops.  Resting in an upright position may help reduce pressure in the middle ear and relieve pain.  Ear pain caused by rapidly descending from high altitudes can be relieved by swallowing or chewing gum. Allowing infants to suck on a bottle during airplane travel can help.  Do not smoke in the house or near children. If you are unable to quit smoking, smoke outside.  Control allergies. SEEK IMMEDIATE MEDICAL CARE IF:   You or your child are becoming sicker.  Pain or fever relief is not obtained with medicine.  You or your child's symptoms (pain, fever, or irritability) do not improve within 24 to 48 hours or as instructed.  Severe pain suddenly stops hurting. This may indicate a ruptured eardrum.  You or your children develop new problems such as severe headaches, stiff neck, difficulty swallowing, or swelling of the face or around the ear. Document Released: 09/23/2003 Document Revised: 04/30/2011 Document Reviewed: 01/28/2008 [facility] Patient Information 2014 [place].   Upper Respiratory Infection, Adult An upper respiratory infection (URI) is also sometimes known as the common cold. The upper respiratory tract includes the nose, sinuses, throat, trachea, and bronchi. Bronchi are the airways leading to the lungs. Most people improve within 1 week, but symptoms can last up to 2 weeks. A residual cough may last even longer.  CAUSES Many different viruses can infect the tissues lining the upper respiratory tract. The tissues become irritated and inflamed and often become very moist. Mucus production is also common. A cold is contagious. You can easily spread the virus to others by oral contact. This includes kissing, sharing a glass, coughing, or sneezing.  Touching your mouth or nose and then touching a surface, which is then touched by another person, can also spread the virus. SYMPTOMS  Symptoms typically develop 1 to 3 days after you come in contact with a cold virus. Symptoms vary from person to person. They may include:  Runny nose.  Sneezing.  Nasal congestion.  Sinus irritation.  Sore throat.  Loss of voice (laryngitis).  Cough.  Fatigue.  Muscle aches.  Loss of appetite.  Headache.  Low-grade fever. DIAGNOSIS  You might diagnose your own cold based on familiar symptoms, since most people get a cold 2 to 3 times a year. Your caregiver can confirm this based on your exam. Most importantly, your caregiver can check that your symptoms are not due to another disease such as strep throat, sinusitis, pneumonia, asthma, or epiglottitis. Blood tests, throat tests, and X-rays are not necessary to diagnose a common cold, but they may sometimes be helpful  in excluding other more serious diseases. Your caregiver will decide if any further tests are required. RISKS AND COMPLICATIONS  You may be at risk for a more severe case of the common cold if you smoke cigarettes, have chronic heart disease (such as heart failure) or lung disease (such as asthma), or if you have a weakened immune system. The very young and very old are also at risk for more serious infections. Bacterial sinusitis, middle ear infections, and bacterial pneumonia can complicate the common cold. The common cold can worsen asthma and chronic obstructive pulmonary disease (COPD). Sometimes, these complications can require emergency medical care and may be life-threatening. PREVENTION  The best way to protect against getting a cold is to practice good hygiene. Avoid oral or hand contact with people with cold symptoms. Wash your hands often if contact occurs. There is no clear evidence that vitamin C, vitamin E, echinacea, or exercise reduces the chance of developing a cold.  However, it is always recommended to get plenty of rest and practice good nutrition. TREATMENT  Treatment is directed at relieving symptoms. There is no cure. Antibiotics are not effective, because the infection is caused by a virus, not by bacteria. Treatment may include:  Increased fluid intake. Sports drinks offer valuable electrolytes, sugars, and fluids.  Breathing heated mist or steam (vaporizer or shower).  Eating chicken soup or other clear broths, and maintaining good nutrition.  Getting plenty of rest.  Using gargles or lozenges for comfort.  Controlling fevers with ibuprofen or acetaminophen as directed by your caregiver.  Increasing usage of your inhaler if you have asthma. Zinc gel and zinc lozenges, taken in the first 24 hours of the common cold, can shorten the duration and lessen the severity of symptoms. Pain medicines may help with fever, muscle aches, and throat pain. A variety of non-prescription medicines are available to treat congestion and runny nose. Your caregiver can make recommendations and may suggest nasal or lung inhalers for other symptoms.  HOME CARE INSTRUCTIONS   Only take over-the-counter or prescription medicines for pain, discomfort, or fever as directed by your caregiver.  Use a warm mist humidifier or inhale steam from a shower to increase air moisture. This may keep secretions moist and make it easier to breathe.  Drink enough water and fluids to keep your urine clear or pale yellow.  Rest as needed.  Return to work when your temperature has returned to normal or as your caregiver advises. You may need to stay home longer to avoid infecting others. You can also use a face mask and careful hand washing to prevent spread of the virus. SEEK MEDICAL CARE IF:   After the first few days, you feel you are getting worse rather than better.  You need your caregiver's advice about medicines to control symptoms.  You develop chills, worsening shortness  of breath, or brown or red sputum. These may be signs of pneumonia.  You develop yellow or brown nasal discharge or pain in the face, especially when you bend forward. These may be signs of sinusitis.  You develop a fever, swollen neck glands, pain with swallowing, or white areas in the back of your throat. These may be signs of strep throat. SEEK IMMEDIATE MEDICAL CARE IF:   You have a fever.  You develop severe or persistent headache, ear pain, sinus pain, or chest pain.  You develop wheezing, a prolonged cough, cough up blood, or have a change in your usual mucus (if you have chronic  lung disease).  You develop sore muscles or a stiff neck. Document Released: 08/01/2000 Document Revised: 04/30/2011 Document Reviewed: 06/09/2010 [facility] Patient Information 2014 [place], [place].    You may take 1000 mg of Tylenol every 6 hours as needed for fever and pain.   Please follow up with your primary care physician this week. You probably need a palpation CT scan as your chest x-ray was abnormal.   Final result by Rad Results In Interface (06/10/13 10:00:15)    Narrative:   CLINICAL DATA: Cough. Sore throat.  EXAM: CHEST 2 VIEW  COMPARISON: 03/07/2009 ; 10/11/2009  FINDINGS: Upper zone peripheral coarse interstitial opacity is again identified. Old granulomatous disease. Atherosclerotic aortic arch.  Heart size within normal limits. Thoracic spondylosis. No pleural effusion identified.  IMPRESSION: 1. Coarse interstitial opacities in the lungs favoring the lung apices. Although possibly just an unusually distributed appearance of fibrosis, upper zone interstitial processes can also include silicosis, sarcoidosis, and ankylosing spondylitis as well as pulmonary manifestations of Langerhans cell histiocytosis. High-resolution chest CT could be used for further workup, if clinically warranted, but the appearance is chronic.   Electronically Signed By: Sherryl Barters  M.D. On: 06/10/2013 10:00

## 2013-06-10 NOTE — ED Notes (Signed)
Per pt, states feeling bad for 3-4 days, didn't want to eat, feels weak and both ears hurt, also complaining of abdominal pain-no N/V/D-coughing up mucous, sinus drainage

## 2013-09-09 ENCOUNTER — Encounter: Payer: Self-pay | Admitting: Cardiology

## 2014-01-19 ENCOUNTER — Ambulatory Visit (INDEPENDENT_AMBULATORY_CARE_PROVIDER_SITE_OTHER): Payer: Medicare Other | Admitting: Ophthalmology

## 2014-05-25 ENCOUNTER — Other Ambulatory Visit: Payer: Self-pay | Admitting: Cardiology

## 2014-05-25 DIAGNOSIS — I10 Essential (primary) hypertension: Secondary | ICD-10-CM

## 2014-05-25 NOTE — Telephone Encounter (Signed)
Please advise of refill, pt has not been seen 2013.

## 2014-08-10 ENCOUNTER — Other Ambulatory Visit: Payer: Self-pay | Admitting: Cardiology

## 2014-08-10 ENCOUNTER — Other Ambulatory Visit: Payer: Self-pay

## 2014-08-10 DIAGNOSIS — I10 Essential (primary) hypertension: Secondary | ICD-10-CM

## 2014-08-10 MED ORDER — METOPROLOL SUCCINATE ER 25 MG PO TB24: ORAL_TABLET | ORAL | Status: DC

## 2014-08-10 NOTE — Telephone Encounter (Signed)
Called pt's wife Ronald Norris to inform her that the refill for the Toprol 25 mg was been sent to the pharmacy, because pt has an appointment set up and if the pt has any other problems, questions or concerns to call the office. Wife verbalized understanding.

## 2014-10-12 ENCOUNTER — Ambulatory Visit (INDEPENDENT_AMBULATORY_CARE_PROVIDER_SITE_OTHER): Payer: Medicare Other | Admitting: Cardiology

## 2014-10-12 ENCOUNTER — Encounter: Payer: Self-pay | Admitting: Cardiology

## 2014-10-12 VITALS — BP 223/68 | HR 55 | Ht 68.0 in | Wt 149.0 lb

## 2014-10-12 DIAGNOSIS — N289 Disorder of kidney and ureter, unspecified: Secondary | ICD-10-CM

## 2014-10-12 DIAGNOSIS — I10 Essential (primary) hypertension: Secondary | ICD-10-CM

## 2014-10-12 NOTE — Patient Instructions (Addendum)
Medication Instructions:  Your physician recommends that you continue on your current medications as directed. Please refer to the Current Medication list given to you today.  Labwork: BMET/CBC  Testing/Procedures: A chest x-ray takes a picture of the organs and structures inside the chest, including the heart, lungs, and blood vessels. This test can show several things, including, whether the heart is enlarges; whether fluid is building up in the lungs; and whether pacemaker / defibrillator leads are still in place.  [facility] [place]   Follow-Up: 2 MONTH OV   DECREASE YOUR SALT INTAKE

## 2014-10-12 NOTE — Progress Notes (Signed)
Cardiology Office Note   Date:  10/12/2014   ID:  Ronald Norris, Ronald Norris 01-Dec-1914, MRN [id number]  PCP:  No primary care provider on file.  Cardiologist: Darlin Coco MD  No chief complaint on file.    History of Present Illness: Ronald Norris is a 81 y.o. male who presents for follow-up visit.  He has not been seen here since January 2013.  He has a past history of labile hypertension.  he had a  remote history of partial small bowel obstruction requiring surgery. He had a history of chronic renal insufficiency. He has had BPH.  He previously had seen Dr. Lawerance Bach but has not seen him in several years.  The patient has urinary frequency.  He has nocturia 2. He has a known bifascicular block on EKG. He has been in good general health.  He quit playing golf about 4 years ago.  Up until then he was shooting his age.  Last year his wife took away his car keys and he no longer drives.  The patient has not been expressing any chest pain.  He notes some exertional dyspnea.  He has not been having any peripheral edema.  He has had no dizziness or syncope.  His blood pressure is markedly elevated today and it was checked several times.  The patient does admit to using dietary salt liberally on his food.   Past Medical History  Diagnosis Date  . BPH (benign prostatic hypertrophy)   . SBO (small bowel obstruction)     PARTIAL  . Ventral hernia   . Systolic hypertension     CHRONIC  . Chronic renal insufficiency   . Bifascicular block   . RBBB plus LA hemiblock     Past Surgical History  Procedure Laterality Date  . Laparotomy    . Appendectomy    . Cataract extraction    . Cardiovascular stress test  04/03/2005    EF 67%     Current Outpatient Prescriptions  Medication Sig Dispense Refill  . aspirin 81 MG tablet Take 81 mg by mouth daily.     . beta carotene w/minerals (OCUVITE) tablet Take 0.5 tablets by mouth 2 (two) times daily.    Marland Kitchen latanoprost (XALATAN) 0.005 %  ophthalmic solution Place 1 drop into both eyes at bedtime.     . metoprolol succinate (TOPROL-XL) 25 MG 24 hr tablet Take 12.5 mg by mouth 2 (two) times daily.     No current facility-administered medications for this visit.    Allergies:   Review of patient's allergies indicates no known allergies.    Social History:  The patient  reports that he has quit smoking. He does not have any smokeless tobacco history on file. He reports that he does not drink alcohol or use illicit drugs.   Family History:  The patient's family history includes Pancreatic cancer in his father; Stroke in his mother.    ROS:  Please see the history of present illness.   Otherwise, review of systems are positive for none.   All other systems are reviewed and negative.    PHYSICAL EXAM: VS:  BP 223/68 mmHg  Pulse 55  Ht 5\' 8"  (1.727 m)  Wt 149 lb (67.586 kg)  BMI 22.66 kg/m2 , BMI Body mass index is 22.66 kg/(m^2). GEN: Well nourished, well developed, in no acute distress HEENT: normal Neck: no JVD, carotid bruits, or masses Cardiac: RRR; there is a soft systolic murmur at the base.  No diastolic murmur of aortic insufficiency to account for his wide pulse pressure. Respiratory:  There are some inspiratory rales at the right base.  He is not dyspneic at rest however., normal work of breathing GI: soft, nontender, nondistended, + BS MS: no deformity or atrophy Skin: warm and dry, no rash Neuro:  Strength and sensation are intact Psych: euthymic mood, full affect   EKG:  EKG is ordered today. The ekg ordered today demonstrates sinus bradycardia with first-degree AV block.  Right bundle branch block.  Left anterior fascicular block.  Since prior tracing of 06/10/13, no significant change.   Recent Labs: No results found for requested labs within last 365 days.    Lipid Panel    Component Value Date/Time   CHOL 181 11/23/2010 0915   TRIG 107.0 11/23/2010 0915   HDL 41.60 11/23/2010 0915   CHOLHDL 4  11/23/2010 0915   VLDL 21.4 11/23/2010 0915   LDLCALC 118* 11/23/2010 0915      Wt Readings from Last 3 Encounters:  10/12/14 149 lb (67.586 kg)  02/23/11 156 lb (70.761 kg)  11/23/10 156 lb (70.761 kg)        ASSESSMENT AND PLAN:  1.  Systolic hypertension with wide pulse pressure 2.  Chronic renal insufficiency 3.  Pulmonary rales 4.  BPH   Current medicines are reviewed at length with the patient today.  The patient does not have concerns regarding medicines.  The following changes have been made:  no change  Labs/ tests ordered today include:  Orders Placed This Encounter  Procedures  . DG Chest 2 View  . CBC with Differential/Platelet  . Basic metabolic panel  . EKG 12-Lead    Disposition: The patient is to cut way back on his dietary salt intake.  We are checking labs today including a CBC and a basal metabolic panel.  We are also going to get a chest x-ray.  If his chest x-ray shows cardiomegaly we will consider getting an echocardiogram. Recheck in 2 months for follow-up office visit.  Berna Spare MD 10/12/2014 6:40 PM    [address], [place], [place]  [address] Phone: [phone]; Fax: [phone]

## 2014-10-13 ENCOUNTER — Ambulatory Visit
Admission: RE | Admit: 2014-10-13 | Discharge: 2014-10-13 | Disposition: A | Discharge status: Home / Self Care | Payer: Medicare Other | Source: Ambulatory Visit | Attending: Cardiology | Admitting: Cardiology

## 2014-10-13 DIAGNOSIS — I10 Essential (primary) hypertension: Secondary | ICD-10-CM

## 2014-10-13 LAB — CBC WITH DIFFERENTIAL/PLATELET
BASOS ABS: 0 10*3/uL (ref 0.0–0.1)
Basophils Relative: 0.5 % (ref 0.0–3.0)
EOS ABS: 0.1 10*3/uL (ref 0.0–0.7)
Eosinophils Relative: 1.5 % (ref 0.0–5.0)
HCT: 34.8 % — ABNORMAL LOW (ref 39.0–52.0)
Hemoglobin: 11.4 g/dL — ABNORMAL LOW (ref 13.0–17.0)
LYMPHS ABS: 1.8 10*3/uL (ref 0.7–4.0)
Lymphocytes Relative: 18.9 % (ref 12.0–46.0)
MCHC: 32.8 g/dL (ref 30.0–36.0)
MCV: 99.6 fl (ref 78.0–100.0)
Monocytes Absolute: 0.6 10*3/uL (ref 0.1–1.0)
Monocytes Relative: 6.2 % (ref 3.0–12.0)
NEUTROS ABS: 7 10*3/uL (ref 1.4–7.7)
NEUTROS PCT: 72.9 % (ref 43.0–77.0)
PLATELETS: 223 10*3/uL (ref 150.0–400.0)
RBC: 3.5 Mil/uL — ABNORMAL LOW (ref 4.22–5.81)
RDW: 14.8 % (ref 11.5–15.5)
WBC: 9.6 10*3/uL (ref 4.0–10.5)

## 2014-10-13 LAB — BASIC METABOLIC PANEL
BUN: 36 mg/dL — ABNORMAL HIGH (ref 6–23)
CHLORIDE: 106 meq/L (ref 96–112)
CO2: 28 mEq/L (ref 19–32)
Calcium: 9.3 mg/dL (ref 8.4–10.5)
Creatinine, Ser: 2.39 mg/dL — ABNORMAL HIGH (ref 0.40–1.50)
GFR: 26.83 mL/min — ABNORMAL LOW (ref >60.00)
Glucose, Bld: 91 mg/dL (ref 70–99)
Potassium: 5 mEq/L (ref 3.5–5.1)
SODIUM: 142 meq/L (ref 135–145)

## 2014-10-15 ENCOUNTER — Telehealth: Payer: Self-pay | Admitting: *Deleted

## 2014-10-15 MED ORDER — METOPROLOL SUCCINATE ER 25 MG PO TB24: 12.5000 mg | ORAL_TABLET | Freq: Two times a day (BID) | ORAL | Status: DC

## 2014-10-15 NOTE — Telephone Encounter (Signed)
-----   Message from Darlin Coco, MD sent at 10/14/2014  7:55 AM EDT ----- Please report.  His heart size is normal.  There is no evidence of heart failure.  He does have interstitial fibrosis which accounts for the rales audible on examination.  Continue current medication.  Cut back on dietary salt to help with blood pressure control.

## 2014-10-28 NOTE — Telephone Encounter (Signed)
Notes Recorded by Earvin Hansen on 10/15/2014 at 3:43 PM Advised wife

## 2014-12-27 ENCOUNTER — Encounter: Payer: Self-pay | Admitting: Cardiology

## 2014-12-27 ENCOUNTER — Ambulatory Visit (INDEPENDENT_AMBULATORY_CARE_PROVIDER_SITE_OTHER): Payer: Medicare Other | Admitting: Cardiology

## 2014-12-27 VITALS — BP 142/60 | HR 58 | Ht 68.0 in | Wt 148.0 lb

## 2014-12-27 DIAGNOSIS — I10 Essential (primary) hypertension: Secondary | ICD-10-CM | POA: Diagnosis not present

## 2014-12-27 DIAGNOSIS — N289 Disorder of kidney and ureter, unspecified: Secondary | ICD-10-CM | POA: Diagnosis not present

## 2014-12-27 NOTE — Progress Notes (Signed)
Cardiology Office Note   Date:  12/28/2014   ID:  Norris, Ronald 1914/05/30, MRN [id number]  PCP:  No primary care provider on file.  Cardiologist: Darlin Coco MD  Chief Complaint  Patient presents with  . Hypertension      History of Present Illness: Ronald Norris is a 81 y.o. male who presents for for 6 month follow-up visit  Ronald Norris is a 81 y.o. male who presents for follow-up visit.  He is a retired Pharmacist, community.  He has a past history of labile hypertension. he had a remote history of partial small bowel obstruction requiring surgery. He had a history of chronic renal insufficiency. He has had BPH. He previously had seen Dr. Lawerance Bach but has not seen him in several years. The patient has urinary frequency. He has nocturia 2. He has a known bifascicular block on EKG. He has been in good general health. He quit playing golf about 4 years ago. Up until then he was shooting his age. Last year his wife took away his car keys and he no longer drives. The patient has not been expressing any chest pain. He notes some exertional dyspnea. He has not been having any peripheral edema. He has had no dizziness or syncope. His blood pressure at his last visit was markedly elevated and he had been liberally salting his food.  He has cut way back on sodium and his blood pressure today is normal.  He remains on low-dose beta blocker.  He has not been having any chest pain or shortness of breath.  He does have exertional dyspnea.  His chest x-rays have shown pulmonary fibrosis. Patient also has a problem with chronic tinnitus which she attributes to many years of exposure to dental drills noise. 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 BPH (benign prostatic hypertrophy)   . SBO (small bowel obstruction) (HCC)     PARTIAL  . Ventral hernia   . Systolic hypertension     CHRONIC  . Chronic renal  insufficiency   . Bifascicular block   . RBBB plus LA hemiblock     Past Surgical History  Procedure Laterality Date  . Laparotomy    . Appendectomy    . Cataract extraction    . Cardiovascular stress test  04/03/2005    EF 67%     Current Outpatient Prescriptions  Medication Sig Dispense Refill  . aspirin 81 MG tablet Take 81 mg by mouth daily.     . beta carotene w/minerals (OCUVITE) tablet Take 0.5 tablets by mouth 2 (two) times daily.    Marland Kitchen latanoprost (XALATAN) 0.005 % ophthalmic solution Place 1 drop into both eyes at bedtime.     . metoprolol succinate (TOPROL-XL) 25 MG 24 hr tablet Take 0.5 tablets (12.5 mg total) by mouth 2 (two) times daily. 90 tablet 3   No current facility-administered medications for this visit.    Allergies:   Review of patient's allergies indicates no known allergies.    Social History:  The patient  reports that he has quit smoking. He does not have any smokeless tobacco history on file. He reports that he does not drink alcohol or use illicit drugs.   Family History:  The patient's family history includes Pancreatic cancer in his father; Stroke in his mother.    ROS:  Please see the history of present illness.   Otherwise, review of  systems are positive for none.   All other systems are reviewed and negative.    PHYSICAL EXAM: VS:  BP 142/60 mmHg  Pulse 58  Ht 5\' 8"  (1.727 m)  Wt 148 lb (67.132 kg)  BMI 22.51 kg/m2 , BMI Body mass index is 22.51 kg/(m^2). GEN: Well nourished, well developed, in no acute distress HEENT: normal Neck: no JVD, carotid bruits, or masses Cardiac: RRR; no murmurs, rubs, or gallops,no edema  Respiratory:  Mild bilateral pulmonary inspiratory rales.  normal work of breathing GI: soft, nontender, nondistended, + BS MS: no deformity or atrophy Skin: warm and dry, no rash Neuro:  Strength and sensation are intact Psych: euthymic mood, full affect   EKG:  EKG is not ordered today.    Recent  Labs: 10/12/2014: BUN 36*; Creatinine, Ser 2.39*; Hemoglobin 11.4*; Platelets 223.0; Potassium 5.0; Sodium 142    Lipid Panel    Component Value Date/Time   CHOL 181 11/23/2010 0915   TRIG 107.0 11/23/2010 0915   HDL 41.60 11/23/2010 0915   CHOLHDL 4 11/23/2010 0915   VLDL 21.4 11/23/2010 0915   LDLCALC 118* 11/23/2010 0915      Wt Readings from Last 3 Encounters:  12/27/14 148 lb (67.132 kg)  10/12/14 149 lb (67.586 kg)  02/23/11 156 lb (70.761 kg)         ASSESSMENT AND PLAN:  1. Systolic hypertension, improved on low salt diet 2. Chronic renal insufficiency 3. Pulmonary rales secondary to pulmonary fibrosis 4. BPH   Current medicines are reviewed at length with the patient today.  The patient does not have concerns regarding medicines.  The following changes have been made:  no change  Labs/ tests ordered today include:  No orders of the defined types were placed in this encounter.    Disposition: We gave him a handicap parking sticker today.  The patient will return in 6 months for a follow-up office visit and EKG.  He will follow-up with Dr. Acie Fredrickson.  Berna Spare MD 12/28/2014 11:42 AM    [facility] [address], [place], [place]  [address] Phone: [phone]; Fax: [phone]

## 2014-12-27 NOTE — Patient Instructions (Signed)
Medication Instructions:  Your physician recommends that you continue on your current medications as directed. Please refer to the Current Medication list given to you today.  Labwork: none  Testing/Procedures: none  Follow-Up: Your physician wants you to follow-up in: 6 month ov/ekg with Dr Vilinda Boehringer will receive a reminder letter in the mail two months in advance. If you don't receive a letter, please call our office to schedule the follow-up appointment.  If you need a refill on your cardiac medications before your next appointment, please call your pharmacy.

## 2015-05-29 ENCOUNTER — Other Ambulatory Visit: Payer: Self-pay | Admitting: Cardiology

## 2015-07-05 ENCOUNTER — Ambulatory Visit (INDEPENDENT_AMBULATORY_CARE_PROVIDER_SITE_OTHER): Payer: Medicare Other | Admitting: Cardiovascular Disease

## 2015-07-05 ENCOUNTER — Encounter: Payer: Self-pay | Admitting: Cardiovascular Disease

## 2015-07-05 VITALS — BP 180/60 | HR 61 | Ht 68.0 in | Wt 146.4 lb

## 2015-07-05 DIAGNOSIS — I119 Hypertensive heart disease without heart failure: Secondary | ICD-10-CM | POA: Diagnosis not present

## 2015-07-05 NOTE — Patient Instructions (Signed)

## 2015-07-05 NOTE — Progress Notes (Signed)
Cardiology Office Note   Date:  07/05/2015   ID:  Ronald Norris, Lawry [birth year], MRN [id number]  PCP:  No primary care provider on file.  Cardiologist: Darlin Coco MD  Chief Complaint  Patient presents with  . Follow-up    HTN   Problem list 1. Essential hypertension 2. 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 male who presents for follow-up visit.  He is a retired Pharmacist, community.  He has a past history of labile hypertension. he had a remote history of partial small bowel obstruction requiring surgery. He had a history of chronic renal insufficiency. He has had BPH. He previously had seen Dr. Lawerance Bach but has not seen him in several years. The patient has urinary frequency. He has nocturia 2. He has a known bifascicular block on EKG. He has been in good general health. He quit playing golf about 4 years ago. Up until then he was shooting his age. Last year his wife took away his car keys and he no longer drives. The patient has not been expressing any chest pain. He notes some exertional dyspnea. He has not been having any peripheral edema. He has had no dizziness or syncope. His blood pressure at his last visit was markedly elevated and he had been liberally salting his food.  He has cut way back on sodium and his blood pressure today is normal.  He remains on low-dose beta blocker.  He has not been having any chest pain or shortness of breath.  He does have exertional dyspnea.  His chest x-rays have shown pulmonary fibrosis. Patient also has a problem with chronic tinnitus which she attributes to many years of exposure to dental drills noise. 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     Graduated from [employer] .  Was seen  with wife, [name]   Has a hx of HTN -  No CP , some DOE with walking  Has some kidney issues. Has been told that he has prostate cancer   Past Medical History  Diagnosis Date  . BPH (benign prostatic hypertrophy)   . SBO (small bowel obstruction) (HCC)     PARTIAL  . Ventral hernia   . Systolic hypertension     CHRONIC  . Chronic renal insufficiency   . Bifascicular block   . RBBB plus LA hemiblock     Past Surgical History  Procedure Laterality Date  . Laparotomy    . Appendectomy    . Cataract extraction    . Cardiovascular stress test  04/03/2005    EF 67%     Current Outpatient Prescriptions  Medication Sig Dispense Refill  . aspirin 81 MG tablet Take 81 mg by mouth daily.     . beta carotene w/minerals (OCUVITE) tablet Take 0.5 tablets by mouth 2 (two) times daily.    Marland Kitchen latanoprost (XALATAN) 0.005 % ophthalmic solution Place 1 drop into both eyes at bedtime.     . metoprolol succinate (TOPROL-XL) 25 MG 24 hr tablet Take 0.5 tablets (12.5 mg total) by mouth 2 (two) times daily. 90 tablet 3  . tamsulosin (FLOMAX) 0.4 MG CAPS capsule Take 0.4 mg by mouth daily.     No  current facility-administered medications for this visit.    Allergies:   Review of patient's allergies indicates no known allergies.    Social History:  The patient  reports that he has quit smoking. He does not have any smokeless tobacco history on file. He reports that he does not drink alcohol or use illicit drugs.   Family History:  The patient's family history includes Pancreatic cancer in his father; Stroke in his mother.    ROS:  Please see the history of present illness.   Otherwise, review of systems are positive for none.   All other systems are reviewed and negative.    PHYSICAL EXAM: VS:  BP 180/60 mmHg  Pulse 61  Ht 5\' 8"  (1.727 m)  Wt 146 lb 6.4 oz (66.407 kg)  BMI 22.27 kg/m2 , BMI Body mass index is 22.27 kg/(m^2). GEN: Well nourished, well developed, in no acute distress HEENT:  normal Neck: no JVD, carotid bruits, or masses Cardiac: RRR; no murmurs, rubs, or gallops,no edema  Respiratory:  Mild bilateral pulmonary inspiratory rales.  normal work of breathing GI: soft, nontender, nondistended, + BS MS: no deformity or atrophy Skin: warm and dry, no rash Neuro:  Strength and sensation are intact Psych: euthymic mood, full affect   EKG:  EKG is ordered today.  NSR at 61.   1st degree AV block  RBBB, LAHB.    Recent Labs: 10/12/2014: BUN 36*; Creatinine, Ser 2.39*; Hemoglobin 11.4*; Platelets 223.0; Potassium 5.0; Sodium 142    Lipid Panel    Component Value Date/Time   CHOL 181 11/23/2010 0915   TRIG 107.0 11/23/2010 0915   HDL 41.60 11/23/2010 0915   CHOLHDL 4 11/23/2010 0915   VLDL 21.4 11/23/2010 0915   LDLCALC 118* 11/23/2010 0915      Wt Readings from Last 3 Encounters:  07/05/15 146 lb 6.4 oz (66.407 kg)  12/27/14 148 lb (67.132 kg)  10/12/14 149 lb (67.586 kg)         ASSESSMENT AND PLAN:  1. Systolic hypertension, improved on low salt diet 2. Chronic renal insufficiency 3. Pulmonary rales secondary to pulmonary fibrosis 4. BPH   Current medicines are reviewed at length with the patient today.  The patient does not have concerns regarding medicines.  The following changes have been made:  no change  Labs/ tests ordered today include:  No orders of the defined types were placed in this encounter.    Disposition: We gave him a handicap parking sticker today.  The patient will return in 6 months for a follow-up office visit and EKG.

## 2016-02-07 ENCOUNTER — Other Ambulatory Visit: Payer: Self-pay | Admitting: *Deleted

## 2016-02-07 MED ORDER — METOPROLOL SUCCINATE ER 25 MG PO TB24: 12.5000 mg | ORAL_TABLET | Freq: Two times a day (BID) | ORAL | 1 refills | Status: DC

## 2016-08-23 ENCOUNTER — Encounter (INDEPENDENT_AMBULATORY_CARE_PROVIDER_SITE_OTHER): Payer: Self-pay

## 2016-08-23 ENCOUNTER — Ambulatory Visit (INDEPENDENT_AMBULATORY_CARE_PROVIDER_SITE_OTHER): Payer: Medicare Other | Admitting: Cardiovascular Disease

## 2016-08-23 ENCOUNTER — Encounter: Payer: Self-pay | Admitting: Cardiovascular Disease

## 2016-08-23 VITALS — BP 162/80 | HR 70 | Ht 68.0 in | Wt 131.0 lb

## 2016-08-23 DIAGNOSIS — I119 Hypertensive heart disease without heart failure: Secondary | ICD-10-CM | POA: Diagnosis not present

## 2016-08-23 DIAGNOSIS — I493 Ventricular premature depolarization: Secondary | ICD-10-CM

## 2016-08-23 DIAGNOSIS — Z79899 Other long term (current) drug therapy: Secondary | ICD-10-CM

## 2016-08-23 DIAGNOSIS — R5383 Other fatigue: Secondary | ICD-10-CM

## 2016-08-23 NOTE — Progress Notes (Signed)
Cardiology Office Note   Date:  08/23/2016   ID:  Ellias, Mcelreath [birth year], MRN [id number]  PCP:  No primary care provider on file.  Cardiologist: Darlin Coco MD  Chief Complaint  Patient presents with  . Hypertension   Problem list 1. Essential hypertension 2. Pulmonary fibrosis 3.  Premature ventricular contraction.    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 male who presents for follow-up visit.  He is a retired Pharmacist, community.  He has a past history of labile hypertension. he had a remote history of partial small bowel obstruction requiring surgery. He had a history of chronic renal insufficiency. He has had BPH. He previously had seen Dr. Lawerance Bach but has not seen him in several years. The patient has urinary frequency. He has nocturia 2. He has a known bifascicular block on EKG. He has been in good general health. He quit playing golf about 4 years ago. Up until then he was shooting his age. Last year his wife took away his car keys and he no longer drives. The patient has not been expressing any chest pain. He notes some exertional dyspnea. He has not been having any peripheral edema. He has had no dizziness or syncope. His blood pressure at his last visit was markedly elevated and he had been liberally salting his food.  He has cut way back on sodium and his blood pressure today is normal.  He remains on low-dose beta blocker.  He has not been having any chest pain or shortness of breath.  He does have exertional dyspnea.  His chest x-rays have shown pulmonary fibrosis. Patient also has a problem with chronic tinnitus which she attributes to many years of exposure to dental drills noise. 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      Graduated from [employer] .  Was seen with wife, [name]   Has a hx of HTN -  No CP , some DOE with walking  Has some kidney issues. Has been told that he has prostate cancer   August 23, 2016: Dr. [name] is seen back for his HTN. Complains that his heart isn't doing right  Has lack of energy No syncope   Past Medical History:  Diagnosis Date  . Bifascicular block   . BPH (benign prostatic hypertrophy)   . Chronic renal insufficiency   . RBBB plus LA hemiblock   . SBO (small bowel obstruction)    PARTIAL  . Systolic hypertension    CHRONIC  . Ventral hernia     Past Surgical History:  Procedure Laterality Date  . APPENDECTOMY    . CARDIOVASCULAR STRESS TEST  04/03/2005   EF 67%  . CATARACT EXTRACTION    . LAPAROTOMY       Current Outpatient Prescriptions  Medication Sig Dispense Refill  . aspirin 81 MG tablet Take 81 mg by mouth daily.     . beta carotene w/minerals (OCUVITE) tablet Take 0.5 tablets by mouth 2 (two) times daily.    Marland Kitchen latanoprost (XALATAN) 0.005 % ophthalmic solution Place 1 drop into both eyes at bedtime.     . metoprolol succinate (TOPROL-XL) 25 MG 24 hr tablet Take 0.5 tablets (12.5 mg total) by mouth 2 (  two) times daily. 90 tablet 1  . tamsulosin (FLOMAX) 0.4 MG CAPS capsule Take 0.4 mg by mouth daily.     No current facility-administered medications for this visit.     Allergies:   Patient has no known allergies.    Social History:  The patient  reports that he has quit smoking. He does not have any smokeless tobacco history on file. He reports that he does not drink alcohol or use drugs.   Family History:  The patient's family history includes Pancreatic cancer in his father; Stroke in his mother.    ROS:  Please see the history of present illness.   Otherwise, review of systems are positive for none.   All other systems are reviewed and negative.    PHYSICAL EXAM: VS:  There were no vitals taken for this visit. , BMI There is no height or weight  on file to calculate BMI. GEN: Well nourished, well developed, in no acute distress  HEENT: normal  Neck: no JVD, carotid bruits, or masses Cardiac: RRR; no murmurs, rubs, or gallops,no edema  Respiratory:  Mild bilateral pulmonary inspiratory rales.  normal work of breathing GI: soft, nontender, nondistended, + BS MS: no deformity or atrophy  Skin: warm and dry, no rash Neuro:  Strength and sensation are intact Psych: euthymic mood, full affect   EKG:  EKG is ordered today.  NSR at 70.  Frequent PVCs   1st degree AV block  RBBB, LAHB.    Recent Labs: No results found for requested labs within last 8760 hours.    Lipid Panel    Component Value Date/Time   CHOL 181 11/23/2010 0915   TRIG 107.0 11/23/2010 0915   HDL 41.60 11/23/2010 0915   CHOLHDL 4 11/23/2010 0915   VLDL 21.4 11/23/2010 0915   LDLCALC 118 (H) 11/23/2010 0915      Wt Readings from Last 3 Encounters:  07/05/15 146 lb 6.4 oz (66.4 kg)  12/27/14 148 lb (67.1 kg)  10/12/14 149 lb (67.6 kg)         ASSESSMENT AND PLAN:  1. Systolic hypertension,  - continue meds.  Will get labs   2. Fatigue:  Will get labs today - BMp, CBC, TSH, LFTs. Needs to get a primary md.  2. Chronic renal insufficiency - has not been seeing a primary MD   3. Pulmonary rales secondary to pulmonary fibrosis  4. BPH   Current medicines are reviewed at length with the patient today.  The patient does not have concerns regarding medicines.  The following changes have been made:  no change  Labs/ tests ordered today include:  No orders of the defined types were placed in this encounter.   Will see in 1 year    [name], MD  08/23/2016 11:15 AM    [address],  [place], [place]  [address] Pager [phone] Phone: [phone]; Fax: [phone]

## 2016-08-23 NOTE — Patient Instructions (Addendum)
Medication Instructions:    Your physician recommends that you continue on your current medications as directed. Please refer to the Current Medication list given to you today.  - If you need a refill on your cardiac medications before your next appointment, please call your pharmacy.   Labwork:  Today:  CMET, CBC w/ diff & TSH  Testing/Procedures:  None ordered  Follow-Up:  Your physician wants you to follow-up in: 1 year with Dr. Acie Fredrickson.  You will receive a reminder letter in the mail two months in advance. If you don't receive a letter, please call our office to schedule the follow-up appointment.  Thank you for choosing [facility]!!

## 2016-08-24 LAB — CBC WITH DIFFERENTIAL/PLATELET
Basophils Absolute: 0 10*3/uL (ref 0.0–0.2)
Basos: 0 %
EOS (ABSOLUTE): 0.2 10*3/uL (ref 0.0–0.4)
EOS: 2 %
HEMATOCRIT: 33.1 % — AB (ref 37.5–51.0)
HEMOGLOBIN: 10.7 g/dL — AB (ref 13.0–17.7)
Immature Grans (Abs): 0 10*3/uL (ref 0.0–0.1)
Immature Granulocytes: 0 %
Lymphocytes Absolute: 1.9 10*3/uL (ref 0.7–3.1)
Lymphs: 19 %
MCH: 31.8 pg (ref 26.6–33.0)
MCHC: 32.3 g/dL (ref 31.5–35.7)
MCV: 98 fL — ABNORMAL HIGH (ref 79–97)
MONOCYTES: 8 %
Monocytes Absolute: 0.8 10*3/uL (ref 0.1–0.9)
NEUTROS ABS: 6.8 10*3/uL (ref 1.4–7.0)
Neutrophils: 71 %
Platelets: 233 10*3/uL (ref 150–379)
RBC: 3.37 x10E6/uL — AB (ref 4.14–5.80)
RDW: 14.5 % (ref 12.3–15.4)
WBC: 9.6 10*3/uL (ref 3.4–10.8)

## 2016-08-24 LAB — COMPREHENSIVE METABOLIC PANEL
ALBUMIN: 4.1 g/dL (ref 3.2–4.6)
ALT: 11 IU/L (ref 0–44)
AST: 21 IU/L (ref 0–40)
Albumin/Globulin Ratio: 1.6 (ref 1.2–2.2)
Alkaline Phosphatase: 67 IU/L (ref 39–117)
BUN/Creatinine Ratio: 13 (ref 10–24)
BUN: 34 mg/dL (ref 10–36)
Bilirubin Total: 0.4 mg/dL (ref 0.0–1.2)
CALCIUM: 9.7 mg/dL (ref 8.6–10.2)
CHLORIDE: 106 mmol/L (ref 96–106)
CO2: 24 mmol/L (ref 20–29)
CREATININE: 2.55 mg/dL — AB (ref 0.76–1.27)
GFR calc Af Amer: 23 mL/min/{1.73_m2} — ABNORMAL LOW (ref >59)
GFR, EST NON AFRICAN AMERICAN: 20 mL/min/{1.73_m2} — AB (ref >59)
Globulin, Total: 2.6 g/dL (ref 1.5–4.5)
Glucose: 122 mg/dL — ABNORMAL HIGH (ref 65–99)
Potassium: 5.3 mmol/L — ABNORMAL HIGH (ref 3.5–5.2)
Sodium: 144 mmol/L (ref 134–144)
Total Protein: 6.7 g/dL (ref 6.0–8.5)

## 2016-08-24 LAB — TSH: TSH: 2.26 u[IU]/mL (ref 0.450–4.500)

## 2017-03-07 ENCOUNTER — Other Ambulatory Visit: Payer: Self-pay | Admitting: Cardiovascular Disease

## 2017-03-11 ENCOUNTER — Emergency Department (HOSPITAL_COMMUNITY): Payer: Medicare Other

## 2017-03-11 ENCOUNTER — Inpatient Hospital Stay (HOSPITAL_COMMUNITY)
Admission: EM | Admit: 2017-03-11 | Discharge: 2017-03-22 | DRG: 682 | Disposition: E | Payer: Medicare Other | Attending: Internal Medicine | Admitting: Internal Medicine

## 2017-03-11 ENCOUNTER — Other Ambulatory Visit: Payer: Self-pay

## 2017-03-11 ENCOUNTER — Encounter (HOSPITAL_COMMUNITY): Payer: Self-pay

## 2017-03-11 DIAGNOSIS — N400 Enlarged prostate without lower urinary tract symptoms: Secondary | ICD-10-CM | POA: Diagnosis present

## 2017-03-11 DIAGNOSIS — E875 Hyperkalemia: Secondary | ICD-10-CM | POA: Diagnosis present

## 2017-03-11 DIAGNOSIS — I5023 Acute on chronic systolic (congestive) heart failure: Secondary | ICD-10-CM | POA: Diagnosis not present

## 2017-03-11 DIAGNOSIS — R001 Bradycardia, unspecified: Secondary | ICD-10-CM | POA: Diagnosis not present

## 2017-03-11 DIAGNOSIS — I130 Hypertensive heart and chronic kidney disease with heart failure and stage 1 through stage 4 chronic kidney disease, or unspecified chronic kidney disease: Secondary | ICD-10-CM | POA: Diagnosis present

## 2017-03-11 DIAGNOSIS — I469 Cardiac arrest, cause unspecified: Secondary | ICD-10-CM | POA: Diagnosis not present

## 2017-03-11 DIAGNOSIS — R339 Retention of urine, unspecified: Secondary | ICD-10-CM

## 2017-03-11 DIAGNOSIS — I429 Cardiomyopathy, unspecified: Secondary | ICD-10-CM | POA: Diagnosis present

## 2017-03-11 DIAGNOSIS — Z800 Family history of malignant neoplasm of digestive organs: Secondary | ICD-10-CM

## 2017-03-11 DIAGNOSIS — N320 Bladder-neck obstruction: Secondary | ICD-10-CM | POA: Diagnosis present

## 2017-03-11 DIAGNOSIS — J690 Pneumonitis due to inhalation of food and vomit: Secondary | ICD-10-CM | POA: Diagnosis not present

## 2017-03-11 DIAGNOSIS — Z9079 Acquired absence of other genital organ(s): Secondary | ICD-10-CM

## 2017-03-11 DIAGNOSIS — Z87891 Personal history of nicotine dependence: Secondary | ICD-10-CM | POA: Diagnosis not present

## 2017-03-11 DIAGNOSIS — J9601 Acute respiratory failure with hypoxia: Secondary | ICD-10-CM | POA: Diagnosis not present

## 2017-03-11 DIAGNOSIS — I708 Atherosclerosis of other arteries: Secondary | ICD-10-CM | POA: Diagnosis present

## 2017-03-11 DIAGNOSIS — Z66 Do not resuscitate: Secondary | ICD-10-CM | POA: Diagnosis not present

## 2017-03-11 DIAGNOSIS — N139 Obstructive and reflux uropathy, unspecified: Secondary | ICD-10-CM

## 2017-03-11 DIAGNOSIS — N136 Pyonephrosis: Secondary | ICD-10-CM | POA: Diagnosis present

## 2017-03-11 DIAGNOSIS — N184 Chronic kidney disease, stage 4 (severe): Secondary | ICD-10-CM | POA: Diagnosis present

## 2017-03-11 DIAGNOSIS — N179 Acute kidney failure, unspecified: Principal | ICD-10-CM | POA: Diagnosis present

## 2017-03-11 DIAGNOSIS — R0989 Other specified symptoms and signs involving the circulatory and respiratory systems: Secondary | ICD-10-CM

## 2017-03-11 DIAGNOSIS — G9341 Metabolic encephalopathy: Secondary | ICD-10-CM | POA: Diagnosis present

## 2017-03-11 DIAGNOSIS — G9340 Encephalopathy, unspecified: Secondary | ICD-10-CM | POA: Diagnosis not present

## 2017-03-11 DIAGNOSIS — I361 Nonrheumatic tricuspid (valve) insufficiency: Secondary | ICD-10-CM | POA: Diagnosis not present

## 2017-03-11 LAB — CBC
HEMATOCRIT: 32.1 % — AB (ref 39.0–52.0)
Hemoglobin: 11.2 g/dL — ABNORMAL LOW (ref 13.0–17.0)
MCH: 33.8 pg (ref 26.0–34.0)
MCHC: 34.9 g/dL (ref 30.0–36.0)
MCV: 97 fL (ref 78.0–100.0)
Platelets: 289 10*3/uL (ref 150–400)
RBC: 3.31 MIL/uL — ABNORMAL LOW (ref 4.22–5.81)
RDW: 14.8 % (ref 11.5–15.5)
WBC: 18.1 10*3/uL — ABNORMAL HIGH (ref 4.0–10.5)

## 2017-03-11 LAB — BASIC METABOLIC PANEL
Anion gap: 17 — ABNORMAL HIGH (ref 5–15)
BUN: 145 mg/dL — ABNORMAL HIGH (ref 6–20)
CALCIUM: 8.9 mg/dL (ref 8.9–10.3)
CO2: 18 mmol/L — AB (ref 22–32)
Chloride: 104 mmol/L (ref 101–111)
Creatinine, Ser: 12.34 mg/dL — ABNORMAL HIGH (ref 0.61–1.24)
GFR calc Af Amer: 3 mL/min — ABNORMAL LOW (ref >60)
GFR calc non Af Amer: 3 mL/min — ABNORMAL LOW (ref >60)
Glucose, Bld: 94 mg/dL (ref 65–99)
Potassium: 6.2 mmol/L — ABNORMAL HIGH (ref 3.5–5.1)
SODIUM: 139 mmol/L (ref 135–145)

## 2017-03-11 LAB — URINALYSIS, ROUTINE W REFLEX MICROSCOPIC
Bacteria, UA: NONE SEEN
Bilirubin Urine: NEGATIVE
GLUCOSE, UA: 50 mg/dL — AB
KETONES UR: NEGATIVE mg/dL
NITRITE: NEGATIVE
Protein, ur: 100 mg/dL — AB
Specific Gravity, Urine: 1.015 (ref 1.005–1.030)
pH: 5 (ref 5.0–8.0)

## 2017-03-11 LAB — I-STAT TROPONIN, ED: TROPONIN I, POC: 0.42 ng/mL — AB (ref 0.00–0.08)

## 2017-03-11 LAB — I-STAT CHEM 8, ED
BUN: 127 mg/dL — ABNORMAL HIGH (ref 6–20)
CHLORIDE: 108 mmol/L (ref 101–111)
Calcium, Ion: 1.06 mmol/L — ABNORMAL LOW (ref 1.15–1.40)
Creatinine, Ser: 12.9 mg/dL — ABNORMAL HIGH (ref 0.61–1.24)
GLUCOSE: 89 mg/dL (ref 65–99)
HCT: 36 % — ABNORMAL LOW (ref 39.0–52.0)
HEMOGLOBIN: 12.2 g/dL — AB (ref 13.0–17.0)
POTASSIUM: 6 mmol/L — AB (ref 3.5–5.1)
Sodium: 137 mmol/L (ref 135–145)
TCO2: 18 mmol/L — ABNORMAL LOW (ref 22–32)

## 2017-03-11 LAB — I-STAT CG4 LACTIC ACID, ED: Lactic Acid, Venous: 1.29 mmol/L (ref 0.5–1.9)

## 2017-03-11 MED ORDER — LIDOCAINE HCL 2 % EX GEL
1.0000 "application " | Freq: Once | CUTANEOUS | Status: AC
  Administered 2017-03-11: 1 via URETHRAL
  Filled 2017-03-11: qty 5

## 2017-03-11 MED ORDER — ALBUTEROL (5 MG/ML) CONTINUOUS INHALATION SOLN
10.0000 mg/h | INHALATION_SOLUTION | Freq: Once | RESPIRATORY_TRACT | Status: AC
  Administered 2017-03-11: 10 mg/h via RESPIRATORY_TRACT
  Filled 2017-03-11: qty 20

## 2017-03-11 MED ORDER — SODIUM CHLORIDE 0.9 % IV SOLN
1.0000 g | Freq: Once | INTRAVENOUS | Status: AC
  Administered 2017-03-11: 1 g via INTRAVENOUS
  Filled 2017-03-11: qty 10

## 2017-03-11 MED ORDER — DEXTROSE 50 % IV SOLN
1.0000 | Freq: Once | INTRAVENOUS | Status: AC
Start: 2017-03-11 — End: 2017-03-11
  Administered 2017-03-11: 50 mL via INTRAVENOUS
  Filled 2017-03-11: qty 50

## 2017-03-11 MED ORDER — SODIUM CHLORIDE 0.9 % IV BOLUS (SEPSIS)
1000.0000 mL | Freq: Once | INTRAVENOUS | Status: AC
  Administered 2017-03-11: 1000 mL via INTRAVENOUS

## 2017-03-11 MED ORDER — STERILE WATER FOR INJECTION IV SOLN
INTRAVENOUS | Status: DC
  Administered 2017-03-11: 21:00:00 via INTRAVENOUS
  Filled 2017-03-11 (×3): qty 850

## 2017-03-11 MED ORDER — INSULIN ASPART 100 UNIT/ML ~~LOC~~ SOLN
5.0000 [IU] | Freq: Once | SUBCUTANEOUS | Status: AC
  Administered 2017-03-11: 5 [IU] via SUBCUTANEOUS
  Filled 2017-03-11: qty 1

## 2017-03-11 NOTE — Consult Note (Signed)
Subjective: CC: Urinary retention.  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 Myrene Buddy for urinary retention and difficult foley placement.   The patient has a remote history of a TURP and a prostate nodule but no clear diagnosis of prostate cancer.   He has had increased voiding difficulty for the last couple of months at least and has been unable to void for the last few days other than some minor dribbling.  He has had no complaint of abdominal pain despite 2038ml in his bladder on scanning and bilateral hydro.  He has had no hematuria or dysuria and no fever.   His Cr is 12.9 with a potassium of 6 and a WBC count of 18K.   Attempts were made with a 14fr and 76fr coude foleys without success.   He has been a patient of Dr. Lawerance Bach who is now with [facility] and was last seen in 1/17.   ROS:  Review of Systems  Unable to perform ROS: Mental acuity    No Known Allergies  Past Medical History:  Diagnosis Date  . Bifascicular block   . BPH (benign prostatic hypertrophy)   . Chronic renal insufficiency   . RBBB plus LA hemiblock   . SBO (small bowel obstruction) (HCC)    PARTIAL  . Systolic hypertension    CHRONIC  . Ventral hernia     Past Surgical History:  Procedure Laterality Date  . APPENDECTOMY    . CARDIOVASCULAR STRESS TEST  04/03/2005   EF 67%  . CATARACT EXTRACTION    . LAPAROTOMY      Social History   Socioeconomic History  . Marital status: Single    Spouse name: Not on file  . Number of children: Not on file  . Years of education: Not on file  . Highest education level: Not on file  Social Needs  . Financial resource strain: Not on file  . Food insecurity - worry: Not on file  . Food insecurity - inability: Not on file  . Transportation needs - medical: Not on file  . Transportation needs - non-medical: Not on file  Occupational History  . Not on file  Tobacco Use  . Smoking status: Former Research scientist (life sciences)  . Smokeless tobacco:  Never Used  Substance and Sexual Activity  . Alcohol use: No  . Drug use: No  . Sexual activity: Not on file  Other Topics Concern  . Not on file  Social History Narrative  . Not on file    Family History  Problem Relation Age of Onset  . Stroke Mother   . Pancreatic cancer Father     Anti-infectives: Anti-infectives (From admission, onward)   None      Current Facility-Administered Medications  Medication Dose Route Frequency Provider Last Rate Last Dose  . sodium bicarbonate 150 mEq in sterile water 1,000 mL infusion   Intravenous Continuous Duffy Bruce, MD 75 mL/hr at 02/20/2017 2109     Current Outpatient Medications  Medication Sig Dispense Refill  . aspirin 81 MG tablet Take 81 mg by mouth daily.     Marland Kitchen latanoprost (XALATAN) 0.005 % ophthalmic solution Place 1 drop into both eyes at bedtime.     . metoprolol succinate (TOPROL-XL) 25 MG 24 hr tablet TAKE ONE-HALF TABLET BY MOUTH TWICE DAILY 90 tablet 1     Objective: Vital signs in last 24 hours: Temp:  [96.2 F (35.7 C)] 96.2 F (35.7  C) (01/21 1651) Pulse Rate:  [74-76] 76 (01/21 1804) Resp:  [19-26] 26 (01/21 1933) BP: (141-165)/(51-126) 155/71 (01/21 1930) SpO2:  [97 %-100 %] 97 % (01/21 1846) Weight:  [56.7 kg (125 lb)] 56.7 kg (125 lb) (01/21 1757)  Intake/Output from previous day: No intake/output data recorded. Intake/Output this shift: Total I/O In: 1110 [IV Piggyback:1110] Out: -    Physical Exam  Constitutional:  Elderly disheveled appearing WM who is alert but HOH and response only intermittently appropriately to questions.   HENT:  Head: Normocephalic and atraumatic.  Neck: Normal range of motion. Neck supple.  Cardiovascular: Normal rate and regular rhythm.  Pulmonary/Chest: Effort normal. No respiratory distress.  Abdominal: Soft. He exhibits mass (consistent with bladder distention to the umbilicus. ).  Mx surgical scars noted.   Genitourinary:  Genitourinary Comments: Circumcised  phallus with blood at the meatus. Scrotum, testes and epididymis are normal. AP without lesion. No rectal mass. Prostate is small with a left apical nodule which is long standing at least 10 years.  SV's nonpalpable.   Musculoskeletal: Normal range of motion. He exhibits no edema or tenderness.  Neurological:  No focal deficits.    Skin: Skin is warm and dry.  Psychiatric:  Calm but somewhat confused.     Lab Results:  Recent Labs    02/26/2017 1751 02/22/2017 1804  WBC 18.1*  --   HGB 11.2* 12.2*  HCT 32.1* 36.0*  PLT 289  --    BMET Recent Labs    03/16/2017 1751 03/12/2017 1804  NA 139 137  K 6.2* 6.0*  CL 104 108  CO2 18*  --   GLUCOSE 94 89  BUN 145* 127*  CREATININE 12.34* 12.90*  CALCIUM 8.9  --    PT/INR No results for input(s): LABPROT, INR in the last 72 hours. ABG No results for input(s): PHART, HCO3 in the last 72 hours.  Invalid input(s): PCO2, PO2  Studies/Results: Ct Abdomen Pelvis Wo Contrast  Result Date: 02/26/2017 CLINICAL DATA:  increased weakness, requiring assistance to perform ADL's, new onset of incontinence. Per EMS pt is Alert and oriented x 4 EXAM: CT ABDOMEN AND PELVIS WITHOUT CONTRAST TECHNIQUE: Multidetector CT imaging of the abdomen and pelvis was performed following the standard protocol without IV contrast. COMPARISON:  10/10/2009 FINDINGS: Lower chest: Coronary and aortic atheromatous calcifications. No pleural or pericardial effusion. Calcified granuloma in the lateral right middle lobe. Subpleural scarring in the lung bases. Hepatobiliary: No focal liver abnormality is seen. No gallstones, gallbladder wall thickening, or biliary dilatation. Pancreas: Diffuse atrophy without mass or ductal dilatation. Spleen: Calcified granulomas.  Normal in size. Adrenals/Urinary Tract: Normal adrenal glands. Moderate right hydronephrosis and ureterectasis seen down to the level of the SI joint without radiodense calculus. There is also mild left  hydronephrosis and proximal ureterectasis seen down to the level of the SI joint. The urinary bladder is markedly distended extending above the level of the umbilicus. 2.4 cm mid right low-attenuation lesion probably cyst. No urolithiasis. Stomach/Bowel: Stomach is nondilated. Small bowel decompressed. Appendix not discretely identified. Colon is nondilated with no wall thickening or adjacent inflammatory/edematous change. Anastomotic staple line noted in the mid abdomen as before. Vascular/Lymphatic: Heavy aortoiliac atheromatous calcifications. No aneurysm. No abdominal or pelvic adenopathy localized. Bilateral pelvic phleboliths. Reproductive: Prostate is unremarkable. Other: No ascites.  No free air. Musculoskeletal: Spondylitic changes in the lower thoracic and lumbar spine. Postop change L4-5. Normal alignment. Negative for fracture or worrisome bone lesion. 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 IMPRESSION: 1.  Massive distention of the urinary bladder with moderate right and mild left hydronephrosis. 2. Coronary and aortoiliac atherosclerosis. Electronically Signed   By: Lucrezia Europe M.D.   On: 03/02/2017 18:58   Ct Head Wo Contrast  Result Date: 03/14/2017 CLINICAL DATA:  Increasing weakness over the past few days. EXAM: CT HEAD WITHOUT CONTRAST TECHNIQUE: Contiguous axial images were obtained from the base of the skull through the vertex without intravenous contrast. COMPARISON:  None. FINDINGS: Brain: There is some cortical atrophy and chronic microvascular ischemic change. No evidence of acute abnormality including hemorrhage, infarct, mass lesion, mass effect, midline shift or abnormal extra-axial fluid collection. No hydrocephalus or pneumocephalus. Vascular: Atherosclerosis noted. Skull: Intact. Sinuses/Orbits: Negative. Other: None. IMPRESSION: No acute abnormality. Atrophy and chronic microvascular ischemic change. Atherosclerosis. Electronically Signed   By: Inge Rise M.D.   On: 02/22/2017 18:52   Dg  Abdomen Acute W/chest  Result Date: 03/10/2017 CLINICAL DATA:  Increasing weakness. EXAM: DG ABDOMEN ACUTE W/ 1V CHEST COMPARISON:  PA and lateral chest 10/13/2014 and 06/11/2013. FINDINGS: The lungs are emphysematous. Since the prior plain films, there has been an increase in coarse opacities in the upper lung zones bilaterally, worse on the left. Scarring in the left base and a calcified granuloma in the right lung base appear unchanged. Heart size is normal. Atherosclerosis noted. Two views of the abdomen show no free intraperitoneal air. There is mild gaseous distention of small and large bowel most compatible with ileus. No acute bony abnormality or unexpected abdominal calcification is seen. IMPRESSION: Mild gaseous distention of small and large bowel most consistent with ileus. Emphysema. Coarse opacities in the upper lung zones bilaterally likely reflect progressive scarring and fibrosis since 2016. Electronically Signed   By: Inge Rise M.D.   On: 03/05/2017 18:49   Foley placement: see procedure note.   Assessment: AKI secondary to urinary retention with a bladder neck contraction with prior TURP.   Leave foley to drainage.  His bladder will take several weeks to recover from the stretch injury.   He could be considered for a bladder neck incision at some point depending on his recovery.    CC: Dr. Lindell Noe     Irine Seal 02/22/2017 [id number]

## 2017-03-11 NOTE — ED Notes (Signed)
Bed: WA04 Expected date:  Expected time:  Means of arrival:  Comments: 84 year old gen weakness

## 2017-03-11 NOTE — ED Provider Notes (Signed)
[facility] DEPT Provider Note   CSN: [id number] Arrival date & time: 02/25/2017  1612     History   Chief Complaint No chief complaint on file.   HPI Ronald Norris is a 84 y.o. male.  HPI   84 year old male with history of renal insufficiency here with altered mental status.  According to the family, the patient is normally alert, oriented, and only uses a cane to help ambulate.  Over the last week, he is been increasingly confused.  He has been unable to get himself out of bed.  He has had new onset incontinence over the last 2 days, and is having increased stool frequency as well as urinary incontinence.  He has been significantly more confused.  His wife has been trying to help him get up, but was unable to get him out of the bed today.  On my assessment, the patient is markedly confused.  He states he is here because his right knee hurts.  Denies any other complaints.  He is oriented to person only.  Level 5 caveat invoked as remainder of history, ROS, and physical exam limited due to patient's AMS.   Past Medical History:  Diagnosis Date  . Bifascicular block   . BPH (benign prostatic hypertrophy)   . Chronic renal insufficiency   . RBBB plus LA hemiblock   . SBO (small bowel obstruction) (HCC)    PARTIAL  . Systolic hypertension    CHRONIC  . Ventral hernia     Patient Active Problem List   Diagnosis Date Noted  . ARF (acute renal failure) ([facility]) 03/17/2017  . Hyperkalemia 02/20/2017  . Osteoarthritis 02/23/2011  . Benign hypertensive heart disease without heart failure 11/23/2010  . Macular degeneration 11/23/2010  . Constipation 11/23/2010  . Renal insufficiency 11/23/2010  . Anemia 11/23/2010  . BPH with obstruction/lower urinary tract symptoms 11/23/2010    Past Surgical History:  Procedure Laterality Date  . APPENDECTOMY    . CARDIOVASCULAR STRESS TEST  04/03/2005   EF 67%  . CATARACT EXTRACTION    . LAPAROTOMY          Home Medications    Prior to Admission medications   Medication Sig Start Date End Date Taking? Authorizing Provider  aspirin 81 MG tablet Take 81 mg by mouth daily.    Yes [provider]  latanoprost (XALATAN) 0.005 % ophthalmic solution Place 1 drop into both eyes at bedtime.  05/18/13  Yes [provider]  metoprolol succinate (TOPROL-XL) 25 MG 24 hr tablet TAKE ONE-HALF TABLET BY MOUTH TWICE DAILY 03/07/17  Yes Nahser, Wonda Cheng, MD    Family History Family History  Problem Relation Age of Onset  . Stroke Mother   . Pancreatic cancer Father     Social History Social History   Tobacco Use  . Smoking status: Former Research scientist (life sciences)  . Smokeless tobacco: Never Used  Substance Use Topics  . Alcohol use: No  . Drug use: No     Allergies   Patient has no known allergies.   Review of Systems Review of Systems  Constitutional: Positive for fatigue.  Neurological: Positive for weakness.  Psychiatric/Behavioral: Positive for confusion.  All other systems reviewed and are negative.    Physical Exam Updated Vital Signs BP 126/70 (BP Location: Right Arm)   Pulse 98   Temp (!) 96.2 F (35.7 C) (Rectal)   Resp 12   Ht 5\' 8"  (1.727 m)   Wt 56.7 kg (125 lb)  SpO2 91%   BMI 19.01 kg/m   Physical Exam  Constitutional: He appears well-developed and well-nourished. No distress.  HENT:  Head: Normocephalic and atraumatic.  Mildly dry mucous membranes  Eyes: Conjunctivae are normal.  Neck: Neck supple.  Cardiovascular: Normal rate, regular rhythm and normal heart sounds. Exam reveals no friction rub.  No murmur heard. Pulmonary/Chest: Effort normal and breath sounds normal. No respiratory distress. He has no wheezes. He has no rales.  Abdominal: Soft. He exhibits no distension. There is tenderness (Exquisite, diffuse tenderness). There is guarding.  Musculoskeletal: He exhibits no edema.  Neurological: He is alert. He exhibits normal muscle tone.   Alert, but confused.  Loses concentration very quickly.  Oriented to person only.  Moves all joints with 5 strength.  Face is symmetric.  Skin: Skin is warm. Capillary refill takes less than 2 seconds.  Psychiatric: He has a normal mood and affect.  Nursing note and vitals reviewed.    ED Treatments / Results  Labs (all labs ordered are listed, but only abnormal results are displayed) Labs Reviewed  URINALYSIS, ROUTINE W REFLEX MICROSCOPIC - Abnormal; Notable for the following components:      Result Value   Color, Urine RED (*)    APPearance CLOUDY (*)    Glucose, UA 50 (*)    Hgb urine dipstick LARGE (*)    Protein, ur 100 (*)    Leukocytes, UA LARGE (*)    Squamous Epithelial / LPF 0-5 (*)    All other components within normal limits  CBC - Abnormal; Notable for the following components:   WBC 18.1 (*)    RBC 3.31 (*)    Hemoglobin 11.2 (*)    HCT 32.1 (*)    All other components within normal limits  BASIC METABOLIC PANEL - Abnormal; Notable for the following components:   Potassium 6.2 (*)    CO2 18 (*)    BUN 145 (*)    Creatinine, Ser 12.34 (*)    GFR calc non Af Amer 3 (*)    GFR calc Af Amer 3 (*)    Anion gap 17 (*)    All other components within normal limits  I-STAT CHEM 8, ED - Abnormal; Notable for the following components:   Potassium 6.0 (*)    BUN 127 (*)    Creatinine, Ser 12.90 (*)    Calcium, Ion 1.06 (*)    TCO2 18 (*)    Hemoglobin 12.2 (*)    HCT 36.0 (*)    All other components within normal limits  I-STAT TROPONIN, ED - Abnormal; Notable for the following components:   Troponin i, poc 0.42 (*)    All other components within normal limits  URINE CULTURE  TROPONIN I  TROPONIN I  TROPONIN I  BASIC METABOLIC PANEL  COMPREHENSIVE METABOLIC PANEL  CBC  I-STAT CG4 LACTIC ACID, ED    EKG  EKG Interpretation  Date/Time:  Monday March 11 2017 17:58:03 EST Ventricular Rate:  75 PR Interval:    QRS Duration: 142 QT Interval:  427 QTC  Calculation: 477 R Axis:   -72 Text Interpretation:  Sinus or ectopic atrial rhythm Atrial premature complexes Borderline prolonged PR interval Right bundle branch block LVH with IVCD and secondary repol abnrm Borderline prolonged QT interval Since last EKG, pACs are new ST depressions now noted in lead V2 Confirmed by Duffy Bruce [phone]) on 03/16/2017 6:05:45 PM       Radiology Ct Abdomen Pelvis Wo Contrast  Result Date: 02/26/2017 CLINICAL DATA:  increased weakness, requiring assistance to perform ADL's, new onset of incontinence. Per EMS pt is Alert and oriented x 4 EXAM: CT ABDOMEN AND PELVIS WITHOUT CONTRAST TECHNIQUE: Multidetector CT imaging of the abdomen and pelvis was performed following the standard protocol without IV contrast. COMPARISON:  10/10/2009 FINDINGS: Lower chest: Coronary and aortic atheromatous calcifications. No pleural or pericardial effusion. Calcified granuloma in the lateral right middle lobe. Subpleural scarring in the lung bases. Hepatobiliary: No focal liver abnormality is seen. No gallstones, gallbladder wall thickening, or biliary dilatation. Pancreas: Diffuse atrophy without mass or ductal dilatation. Spleen: Calcified granulomas.  Normal in size. Adrenals/Urinary Tract: Normal adrenal glands. Moderate right hydronephrosis and ureterectasis seen down to the level of the SI joint without radiodense calculus. There is also mild left hydronephrosis and proximal ureterectasis seen down to the level of the SI joint. The urinary bladder is markedly distended extending above the level of the umbilicus. 2.4 cm mid right low-attenuation lesion probably cyst. No urolithiasis. Stomach/Bowel: Stomach is nondilated. Small bowel decompressed. Appendix not discretely identified. Colon is nondilated with no wall thickening or adjacent inflammatory/edematous change. Anastomotic staple line noted in the mid abdomen as before. Vascular/Lymphatic: Heavy aortoiliac atheromatous  calcifications. No aneurysm. No abdominal or pelvic adenopathy localized. Bilateral pelvic phleboliths. Reproductive: Prostate is unremarkable. Other: No ascites.  No free air. Musculoskeletal: Spondylitic changes in the lower thoracic and lumbar spine. Postop change L4-5. Normal alignment. Negative for fracture or worrisome bone lesion. 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 IMPRESSION: 1. Massive distention of the urinary bladder with moderate right and mild left hydronephrosis. 2. Coronary and aortoiliac atherosclerosis. Electronically Signed   By: Lucrezia Europe M.D.   On: 03/03/2017 18:58   Ct Head Wo Contrast  Result Date: 03/19/2017 CLINICAL DATA:  Increasing weakness over the past few days. EXAM: CT HEAD WITHOUT CONTRAST TECHNIQUE: Contiguous axial images were obtained from the base of the skull through the vertex without intravenous contrast. COMPARISON:  None. FINDINGS: Brain: There is some cortical atrophy and chronic microvascular ischemic change. No evidence of acute abnormality including hemorrhage, infarct, mass lesion, mass effect, midline shift or abnormal extra-axial fluid collection. No hydrocephalus or pneumocephalus. Vascular: Atherosclerosis noted. Skull: Intact. Sinuses/Orbits: Negative. Other: None. IMPRESSION: No acute abnormality. Atrophy and chronic microvascular ischemic change. Atherosclerosis. Electronically Signed   By: Inge Rise M.D.   On: 03/12/2017 18:52   Dg Abdomen Acute W/chest  Result Date: 02/21/2017 CLINICAL DATA:  Increasing weakness. EXAM: DG ABDOMEN ACUTE W/ 1V CHEST COMPARISON:  PA and lateral chest 10/13/2014 and 06/11/2013. FINDINGS: The lungs are emphysematous. Since the prior plain films, there has been an increase in coarse opacities in the upper lung zones bilaterally, worse on the left. Scarring in the left base and a calcified granuloma in the right lung base appear unchanged. Heart size is normal. Atherosclerosis noted. Two views of the abdomen show no free  intraperitoneal air. There is mild gaseous distention of small and large bowel most compatible with ileus. No acute bony abnormality or unexpected abdominal calcification is seen. IMPRESSION: Mild gaseous distention of small and large bowel most consistent with ileus. Emphysema. Coarse opacities in the upper lung zones bilaterally likely reflect progressive scarring and fibrosis since 2016. Electronically Signed   By: Inge Rise M.D.   On: 03/07/2017 18:49    Procedures .Critical Care Performed by: Duffy Bruce, MD Authorized by: Duffy Bruce, MD   Critical care provider statement:    Critical care time (minutes):  45  Critical care time was exclusive of:  Separately billable procedures and treating other patients and teaching time   Critical care was necessary to treat or prevent imminent or life-threatening deterioration of the following conditions:  Circulatory failure and metabolic crisis   Critical care was time spent personally by me on the following activities:  Development of treatment plan with patient or surrogate, discussions with consultants, evaluation of patient's response to treatment, examination of patient, obtaining history from patient or surrogate, ordering and performing treatments and interventions, ordering and review of laboratory studies, ordering and review of radiographic studies, pulse oximetry, re-evaluation of patient's condition and review of old charts   I assumed direction of critical care for this patient from another provider in my specialty: no     (including critical care time)  Medications Ordered in ED Medications  sodium bicarbonate 150 mEq in sterile water 1,000 mL infusion ( Intravenous New Bag/Given 02/22/2017 2109)  latanoprost (XALATAN) 0.005 % ophthalmic solution 1 drop (not administered)  metoprolol succinate (TOPROL-XL) 24 hr tablet 12.5 mg (not administered)  sodium polystyrene (KAYEXALATE) 15 GM/60ML suspension 30 g (not administered)    heparin injection 5,000 Units (not administered)  0.9 %  sodium chloride infusion (not administered)  acetaminophen (TYLENOL) tablet 650 mg (not administered)    Or  acetaminophen (TYLENOL) suppository 650 mg (not administered)  atorvastatin (LIPITOR) tablet 80 mg (not administered)  cefTRIAXone (ROCEPHIN) 1 g in dextrose 5 % 50 mL IVPB (not administered)  sodium chloride 0.9 % bolus 1,000 mL (0 mLs Intravenous Stopped 02/21/2017 2038)  calcium gluconate 1 g in sodium chloride 0.9 % 100 mL IVPB (0 g Intravenous Stopped 03/21/2017 2109)  dextrose 50 % solution 50 mL (50 mLs Intravenous Given 03/01/2017 1910)  insulin aspart (novoLOG) injection 5 Units (5 Units Subcutaneous Given 03/04/2017 1910)  albuterol (PROVENTIL,VENTOLIN) solution continuous neb (10 mg/hr Nebulization Given 03/01/2017 1843)  sodium chloride 0.9 % bolus 1,000 mL (0 mLs Intravenous Stopped 02/20/2017 2234)  lidocaine (XYLOCAINE) 2 % jelly 1 application (1 application Urethral Given 03/15/2017 2109)     Initial Impression / Assessment and Plan / ED Course  I have reviewed the triage vital signs and the nursing notes.  Pertinent labs & imaging results that were available during my care of the patient were reviewed by me and considered in my medical decision making (see chart for details).     84 year old male here with altered mental status.  Lab work and imaging shows acute renal failure with market uremia, likely secondary to bladder outlet obstruction from known prostate nodule/BPH.  CT imaging is consistent with bilateral hydro-and obstructive uropathy.  I attempted to place a catheter without success.  Dr. Jeffie Pollock of urology was consulted and was able to place the catheter in the emergency department.  Patient had immediate return of urine.  His mental status seems to be improving.  I also discussed the case with nephrology on-call, who recommends starting bicarbonate drip, Dr. Justin Mend.  Will admit to [facility].  Stepdown.  Final Clinical  Impressions(s) / ED Diagnoses   Final diagnoses:  Obstructive uropathy  AKI (acute kidney injury) ([facility])  Encephalopathy  Urinary retention    ED Discharge Orders    None       Duffy Bruce, MD 03/12/17 [id number]

## 2017-03-11 NOTE — Procedures (Signed)
Procedure: Cystoscopy with dilation of bladder neck contracture insertion of Foley catheter.  Preop diagnosis: Acute urinary retention with AK I.  Postop diagnosis: Same secondary to bladder neck contracture.  Surgeon: Dr. Irine Seal.  Anesthesia: None.  Drain: 78 [place] catheter.  EBL: None.  Specimen: None.  Complications: None.  Indications: Mr. Ronald Norris is a 84 year old white male who presented to the emergency room and was found to have urinary retention with a 2 L residual and a creatinine of 12.9 with bilateral hydro-.  Attempts at Foley catheter placement were unsuccessful.  Procedure: He was placed supine on the ER stretcher.  His perineum and genitalia were prepped with Betadine and he was draped with sterile towels and sterile drape.  An initial attempt at placement of a 77 French coud Foley catheter after generous urethral lubrication was unsuccessful.  I then passed a flexible cystoscope.  Inspection revealed normal urethra with minimal mucosal irritation.  The external sphincter appeared intact.  I read was not unable to pass the scope through the prostatic urethra due to the tight stricture.  I advanced a sensor guidewire into the bladder through the scope and removed the scope.  An initial attempt with a 22 French [place] catheter was unsuccessful.  I then used Hayman dilators from 20-24 [place] to dilate the bladder neck contracture.  A 16 French [place] catheter was then advanced into the bladder.  The balloon was filled with 10 cc of sterile fluid.  The wire was removed and the cath was placed to straight drainage.  There was prompt drainage of several 100 cc of urine.  I did not attempt cystoscopy into the bladder after the dilation is I wanted to keep instrumentation to a minimum.

## 2017-03-11 NOTE — ED Notes (Signed)
Attempted to insert a foley catheter, met resistance, was not successful

## 2017-03-11 NOTE — ED Triage Notes (Signed)
Pt arrived via EMS from home with family. Pt has been independent with self care/and ADLS/ Pt  In the past few days pt has had increased weakness, requiring assistance to perform ADL's, new onset of incontinence. Per EMS pt is Alert and oriented x 4    EMS V/S 132/68 HR 90 RR 18 CBG 106 Os sat 98%

## 2017-03-11 NOTE — H&P (Addendum)
TRH H&P   Patient Demographics:    Ronald Norris, is a 84 y.o. male  MRN: [id number]   DOB - [birth year]  Admit Date - 03/09/2017  Outpatient Primary MD for the patient is Patient, No Pcp Per  Referring MD/NP/PA: Lindell Noe  Outpatient Specialists:   Patient coming from: home  No chief complaint on file.     HPI:    Ronald Norris  is a 84 y.o. male, w hypertension, Bph s/p remote TURP, CKD who apparently presents w complaints of voiding difficult for the past several months. And has been unable to void for the past several days other than some minor dribbling.  Pt denies abd pain, fever, chills, n/v, diarrhea, brbpr, dysuria.  Pt was brought to ER for evaluation.    In ED, Pt had >2095mL in his bladder on scan. Urology was able to place foley w cystoscopy with dilation of bladder neck contracture.    CT scan  IMPRESSION: 1. Massive distention of the urinary bladder with moderate right and mild left hydronephrosis. 2. Coronary and aortoiliac atherosclerosis.  Wbc 18.1, Hgb 11.2, Plt 289 Na 139, K 6.2 Bun 145, Creatinine 12.34 Hco3 18   Trop I 0.42  Pt will be admitted for ARF secondary to obstruction, Hyperkalemia,  Troponin elevation         Review of systems:    In addition to the HPI above,  He is not very clear on history No Fever-chills, No Headache, No changes with Vision or hearing, No problems swallowing food or Liquids, No Chest pain, Cough or Shortness of Breath, No Abdominal pain, No Nausea or Vommitting, Bowel movements are regular, No Blood in stool or Urine, No dysuria, No new skin rashes or bruises, No new joints pains-aches,  No new weakness, tingling, numbness in any extremity, No recent weight gain or loss, No polyuria, polydypsia or polyphagia, No significant Mental Stressors.  A full 10 point Review of Systems was done, except  as stated above, all other Review of Systems were negative.   With Past History of the following :    Past Medical History:  Diagnosis Date  . Bifascicular block   . BPH (benign prostatic hypertrophy)   . Chronic renal insufficiency   . RBBB plus LA hemiblock   . SBO (small bowel obstruction) (HCC)    PARTIAL  . Systolic hypertension    CHRONIC  . Ventral hernia       Past Surgical History:  Procedure Laterality Date  . APPENDECTOMY    . CARDIOVASCULAR STRESS TEST  04/03/2005   EF 67%  . CATARACT EXTRACTION    . LAPAROTOMY        Social History:     Social History   Tobacco Use  . Smoking status: Former Research scientist (life sciences)  . Smokeless tobacco: Never Used  Substance Use Topics  . Alcohol use: No  Lives - at home  Mobility - walks by self   Family History :     Family History  Problem Relation Age of Onset  . Stroke Mother   . Pancreatic cancer Father       Home Medications:   Prior to Admission medications   Medication Sig Start Date End Date Taking? Authorizing Provider  aspirin 81 MG tablet Take 81 mg by mouth daily.    Yes [provider]  latanoprost (XALATAN) 0.005 % ophthalmic solution Place 1 drop into both eyes at bedtime.  05/18/13  Yes [provider]  metoprolol succinate (TOPROL-XL) 25 MG 24 hr tablet TAKE ONE-HALF TABLET BY MOUTH TWICE DAILY 03/07/17  Yes Nahser, Wonda Cheng, MD     Allergies:    No Known Allergies   Physical Exam:   Vitals  Blood pressure 126/70, pulse 98, temperature (!) 96.2 F (35.7 C), temperature source Rectal, resp. rate 12, height 5\' 8"  (1.727 m), weight 56.7 kg (125 lb), SpO2 91 %.   1. General  lying in bed in NAD,    2. Normal affect and insight, Not Suicidal or Homicidal, Awake Alert, Oriented X 3.  3. No F.N deficits, ALL C.Nerves Intact, Strength 5/5 all 4 extremities, Sensation intact all 4 extremities, Plantars down going.  4. Ears and Eyes appear Normal, Conjunctivae clear, PERRLA. Moist  Oral Mucosa.  5. Supple Neck, No JVD, No cervical lymphadenopathy appriciated, No Carotid Bruits.  6. Symmetrical Chest wall movement, Good air movement bilaterally, CTAB.  7. RRR, No Gallops, Rubs or Murmurs, No Parasternal Heave.  8. Positive Bowel Sounds, Abdomen Soft, No tenderness, No organomegaly appriciated,No rebound -guarding or rigidity.  9.  No Cyanosis, Normal Skin Turgor, No Skin Rash or Bruise.  10. Good muscle tone,  joints appear normal , no effusions, Normal ROM.  11. No Palpable Lymph Nodes in Neck or Axillae  Foley in place No CVA tenderness  EKG nsr at 75 lad, st depression 1, avl, v2,3    Data Review:    CBC Recent Labs  Lab 03/06/2017 1751 03/17/2017 1804  WBC 18.1*  --   HGB 11.2* 12.2*  HCT 32.1* 36.0*  PLT 289  --   MCV 97.0  --   MCH 33.8  --   MCHC 34.9  --   RDW 14.8  --    ------------------------------------------------------------------------------------------------------------------  Chemistries  Recent Labs  Lab 03/09/2017 1751 03/05/2017 1804  NA 139 137  K 6.2* 6.0*  CL 104 108  CO2 18*  --   GLUCOSE 94 89  BUN 145* 127*  CREATININE 12.34* 12.90*  CALCIUM 8.9  --    ------------------------------------------------------------------------------------------------------------------ estimated creatinine clearance is 2.4 mL/min (A) (by C-G formula based on SCr of 12.9 mg/dL (H)). ------------------------------------------------------------------------------------------------------------------ No results for input(s): TSH, T4TOTAL, T3FREE, THYROIDAB in the last 72 hours.  Invalid input(s): FREET3  Coagulation profile No results for input(s): INR, PROTIME in the last 168 hours. ------------------------------------------------------------------------------------------------------------------- No results for input(s): DDIMER in the last 72  hours. -------------------------------------------------------------------------------------------------------------------  Cardiac Enzymes No results for input(s): CKMB, TROPONINI, MYOGLOBIN in the last 168 hours.  Invalid input(s): CK ------------------------------------------------------------------------------------------------------------------ No results found for: BNP   ---------------------------------------------------------------------------------------------------------------  Urinalysis    Component Value Date/Time   COLORURINE YELLOW 06/10/2013 0933   APPEARANCEUR CLEAR 06/10/2013 0933   LABSPEC 1.018 06/10/2013 0933   PHURINE 5.5 06/10/2013 0933   GLUCOSEU NEGATIVE 06/10/2013 0933   HGBUR SMALL (A) 06/10/2013 0933   BILIRUBINUR NEGATIVE 06/10/2013 0933   Benjamin Stain  NEGATIVE 06/10/2013 0933   PROTEINUR NEGATIVE 06/10/2013 0933   UROBILINOGEN 0.2 06/10/2013 0933   NITRITE NEGATIVE 06/10/2013 0933   LEUKOCYTESUR NEGATIVE 06/10/2013 0933    ----------------------------------------------------------------------------------------------------------------   Imaging Results:    Ct Abdomen Pelvis Wo Contrast  Result Date: 02/27/2017 CLINICAL DATA:  increased weakness, requiring assistance to perform ADL's, new onset of incontinence. Per EMS pt is Alert and oriented x 4 EXAM: CT ABDOMEN AND PELVIS WITHOUT CONTRAST TECHNIQUE: Multidetector CT imaging of the abdomen and pelvis was performed following the standard protocol without IV contrast. COMPARISON:  10/10/2009 FINDINGS: Lower chest: Coronary and aortic atheromatous calcifications. No pleural or pericardial effusion. Calcified granuloma in the lateral right middle lobe. Subpleural scarring in the lung bases. Hepatobiliary: No focal liver abnormality is seen. No gallstones, gallbladder wall thickening, or biliary dilatation. Pancreas: Diffuse atrophy without mass or ductal dilatation. Spleen: Calcified granulomas.  Normal in  size. Adrenals/Urinary Tract: Normal adrenal glands. Moderate right hydronephrosis and ureterectasis seen down to the level of the SI joint without radiodense calculus. There is also mild left hydronephrosis and proximal ureterectasis seen down to the level of the SI joint. The urinary bladder is markedly distended extending above the level of the umbilicus. 2.4 cm mid right low-attenuation lesion probably cyst. No urolithiasis. Stomach/Bowel: Stomach is nondilated. Small bowel decompressed. Appendix not discretely identified. Colon is nondilated with no wall thickening or adjacent inflammatory/edematous change. Anastomotic staple line noted in the mid abdomen as before. Vascular/Lymphatic: Heavy aortoiliac atheromatous calcifications. No aneurysm. No abdominal or pelvic adenopathy localized. Bilateral pelvic phleboliths. Reproductive: Prostate is unremarkable. Other: No ascites.  No free air. Musculoskeletal: Spondylitic changes in the lower thoracic and lumbar spine. Postop change L4-5. Normal alignment. Negative for fracture or worrisome bone lesion. 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 IMPRESSION: 1. Massive distention of the urinary bladder with moderate right and mild left hydronephrosis. 2. Coronary and aortoiliac atherosclerosis. Electronically Signed   By: Lucrezia Europe M.D.   On: 03/18/2017 18:58   Ct Head Wo Contrast  Result Date: 03/07/2017 CLINICAL DATA:  Increasing weakness over the past few days. EXAM: CT HEAD WITHOUT CONTRAST TECHNIQUE: Contiguous axial images were obtained from the base of the skull through the vertex without intravenous contrast. COMPARISON:  None. FINDINGS: Brain: There is some cortical atrophy and chronic microvascular ischemic change. No evidence of acute abnormality including hemorrhage, infarct, mass lesion, mass effect, midline shift or abnormal extra-axial fluid collection. No hydrocephalus or pneumocephalus. Vascular: Atherosclerosis noted. Skull: Intact. Sinuses/Orbits: Negative.  Other: None. IMPRESSION: No acute abnormality. Atrophy and chronic microvascular ischemic change. Atherosclerosis. Electronically Signed   By: Inge Rise M.D.   On: 03/01/2017 18:52   Dg Abdomen Acute W/chest  Result Date: 03/01/2017 CLINICAL DATA:  Increasing weakness. EXAM: DG ABDOMEN ACUTE W/ 1V CHEST COMPARISON:  PA and lateral chest 10/13/2014 and 06/11/2013. FINDINGS: The lungs are emphysematous. Since the prior plain films, there has been an increase in coarse opacities in the upper lung zones bilaterally, worse on the left. Scarring in the left base and a calcified granuloma in the right lung base appear unchanged. Heart size is normal. Atherosclerosis noted. Two views of the abdomen show no free intraperitoneal air. There is mild gaseous distention of small and large bowel most compatible with ileus. No acute bony abnormality or unexpected abdominal calcification is seen. IMPRESSION: Mild gaseous distention of small and large bowel most consistent with ileus. Emphysema. Coarse opacities in the upper lung zones bilaterally likely reflect progressive scarring and fibrosis since 2016. Electronically  Signed   By: Inge Rise M.D.   On: 02/19/2017 18:49      Assessment & Plan:    Principal Problem:   ARF (acute renal failure) (HCC) Active Problems:   Hyperkalemia    ARF secondary to obstruction Foley Hydrate with ns iv Check cmp in am Urology consult appreciated  Hyperkalemia Calcium gluconate Sodium bicarbonate Kayexalate Check bmp now, repeat cmp in am  Troponin elevation ? Due to renal failure Tele Trop I q6h x3 Check lipid Aspirin, lipitor Check cardiac echo Cardiology consult by email in am  UTI Rocephin 1gm iv qday Urine culture pending    DVT Prophylaxis Heparin -  - SCDs  AM Labs Ordered, also please review Full Orders  Family Communication: Admission, patients condition and plan of care including tests being ordered have been discussed with the  patient  who indicate understanding and agree with the plan and Code Status.  Code Status FULL CODE  Likely DC to  home  Condition GUARDED   Consults called: urology by ED  Admission status: inpatient  Time spent in minutes : 45   Jani Gravel M.D on 03/10/2017 at 11:51 PM  Between 7am to 7pm - Pager - [phone]. After 7pm go to [email or web address] - password [email or web address]  [facility] - Office  [phone]

## 2017-03-12 ENCOUNTER — Inpatient Hospital Stay (HOSPITAL_COMMUNITY): Payer: Medicare Other

## 2017-03-12 DIAGNOSIS — N139 Obstructive and reflux uropathy, unspecified: Secondary | ICD-10-CM

## 2017-03-12 DIAGNOSIS — G9340 Encephalopathy, unspecified: Secondary | ICD-10-CM

## 2017-03-12 DIAGNOSIS — I361 Nonrheumatic tricuspid (valve) insufficiency: Secondary | ICD-10-CM

## 2017-03-12 LAB — BASIC METABOLIC PANEL
Anion gap: 16 — ABNORMAL HIGH (ref 5–15)
BUN: 144 mg/dL — AB (ref 6–20)
CO2: 17 mmol/L — AB (ref 22–32)
Calcium: 8.5 mg/dL — ABNORMAL LOW (ref 8.9–10.3)
Chloride: 108 mmol/L (ref 101–111)
Creatinine, Ser: 11.39 mg/dL — ABNORMAL HIGH (ref 0.61–1.24)
GFR calc Af Amer: 4 mL/min — ABNORMAL LOW (ref >60)
GFR calc non Af Amer: 3 mL/min — ABNORMAL LOW (ref >60)
GLUCOSE: 91 mg/dL (ref 65–99)
POTASSIUM: 5.2 mmol/L — AB (ref 3.5–5.1)
Sodium: 141 mmol/L (ref 135–145)

## 2017-03-12 LAB — ECHOCARDIOGRAM COMPLETE
Height: 68 in
Weight: 2000 oz

## 2017-03-12 LAB — COMPREHENSIVE METABOLIC PANEL
ALK PHOS: 63 U/L (ref 38–126)
ALT: 20 U/L (ref 17–63)
AST: 41 U/L (ref 15–41)
Albumin: 2.5 g/dL — ABNORMAL LOW (ref 3.5–5.0)
Anion gap: 14 (ref 5–15)
BILIRUBIN TOTAL: 0.9 mg/dL (ref 0.3–1.2)
BUN: 129 mg/dL — AB (ref 6–20)
CALCIUM: 8.2 mg/dL — AB (ref 8.9–10.3)
CO2: 20 mmol/L — ABNORMAL LOW (ref 22–32)
CREATININE: 9.74 mg/dL — AB (ref 0.61–1.24)
Chloride: 108 mmol/L (ref 101–111)
GFR calc non Af Amer: 4 mL/min — ABNORMAL LOW (ref >60)
GFR, EST AFRICAN AMERICAN: 4 mL/min — AB (ref >60)
Glucose, Bld: 95 mg/dL (ref 65–99)
Potassium: 4.5 mmol/L (ref 3.5–5.1)
Sodium: 142 mmol/L (ref 135–145)
Total Protein: 5.9 g/dL — ABNORMAL LOW (ref 6.5–8.1)

## 2017-03-12 LAB — CBC
HCT: 26.3 % — ABNORMAL LOW (ref 39.0–52.0)
Hemoglobin: 9.1 g/dL — ABNORMAL LOW (ref 13.0–17.0)
MCH: 33 pg (ref 26.0–34.0)
MCHC: 34.6 g/dL (ref 30.0–36.0)
MCV: 95.3 fL (ref 78.0–100.0)
PLATELETS: 240 10*3/uL (ref 150–400)
RBC: 2.76 MIL/uL — AB (ref 4.22–5.81)
RDW: 15 % (ref 11.5–15.5)
WBC: 13.2 10*3/uL — AB (ref 4.0–10.5)

## 2017-03-12 LAB — MRSA PCR SCREENING: MRSA BY PCR: NEGATIVE

## 2017-03-12 LAB — TROPONIN I: TROPONIN I: 1.68 ng/mL — AB (ref <0.03)

## 2017-03-12 MED ORDER — METOPROLOL SUCCINATE ER 25 MG PO TB24
12.5000 mg | ORAL_TABLET | Freq: Two times a day (BID) | ORAL | Status: DC
  Administered 2017-03-12 – 2017-03-13 (×3): 12.5 mg via ORAL
  Filled 2017-03-12: qty 1
  Filled 2017-03-12: qty 0.5
  Filled 2017-03-12: qty 1
  Filled 2017-03-12: qty 0.5
  Filled 2017-03-12: qty 1

## 2017-03-12 MED ORDER — SODIUM CHLORIDE 0.9 % IV SOLN
INTRAVENOUS | Status: DC
  Administered 2017-03-12 (×3): via INTRAVENOUS

## 2017-03-12 MED ORDER — SODIUM POLYSTYRENE SULFONATE 15 GM/60ML PO SUSP
30.0000 g | Freq: Once | ORAL | Status: AC
  Administered 2017-03-12: 30 g via ORAL
  Filled 2017-03-12: qty 120

## 2017-03-12 MED ORDER — LATANOPROST 0.005 % OP SOLN
1.0000 [drp] | Freq: Every day | OPHTHALMIC | Status: DC
  Filled 2017-03-12 (×2): qty 2.5

## 2017-03-12 MED ORDER — HEPARIN SODIUM (PORCINE) 5000 UNIT/ML IJ SOLN
5000.0000 [IU] | Freq: Three times a day (TID) | INTRAMUSCULAR | Status: DC
  Administered 2017-03-12 – 2017-03-13 (×5): 5000 [IU] via SUBCUTANEOUS
  Filled 2017-03-12 (×5): qty 1

## 2017-03-12 MED ORDER — SODIUM CHLORIDE 0.9 % IV SOLN
INTRAVENOUS | Status: DC
  Administered 2017-03-12: 01:00:00 via INTRAVENOUS

## 2017-03-12 MED ORDER — ACETAMINOPHEN 325 MG PO TABS
650.0000 mg | ORAL_TABLET | Freq: Four times a day (QID) | ORAL | Status: DC | PRN
  Administered 2017-03-13: 650 mg via ORAL
  Filled 2017-03-12: qty 2

## 2017-03-12 MED ORDER — ATORVASTATIN CALCIUM 80 MG PO TABS
80.0000 mg | ORAL_TABLET | Freq: Every day | ORAL | Status: AC
  Administered 2017-03-12: 80 mg via ORAL
  Filled 2017-03-12: qty 2
  Filled 2017-03-12: qty 1

## 2017-03-12 MED ORDER — ASPIRIN 81 MG PO CHEW
81.0000 mg | CHEWABLE_TABLET | Freq: Every day | ORAL | Status: DC
  Filled 2017-03-12: qty 1

## 2017-03-12 MED ORDER — ACETAMINOPHEN 650 MG RE SUPP: 650.0000 mg | Freq: Four times a day (QID) | RECTAL | Status: DC | PRN

## 2017-03-12 MED ORDER — DEXTROSE 5 % IV SOLN
1.0000 g | INTRAVENOUS | Status: DC
  Administered 2017-03-12 – 2017-03-13 (×2): 1 g via INTRAVENOUS
  Filled 2017-03-12 (×2): qty 10

## 2017-03-12 NOTE — Progress Notes (Signed)
Patient approved of wife being in the room during admission process questioning

## 2017-03-12 NOTE — ED Notes (Signed)
Went to [employer] antibiotic, patient had removed his IV and fluid was dripping on the floor. He had blood from the IV site everywhere. Cleansed patient and changed his gown, sheets, and applied warm blankets. Patient is alert but altered. Sam, NT assisted.

## 2017-03-12 NOTE — Progress Notes (Signed)
PROGRESS NOTE    Ronald Norris  DGL:[id number] DOB: Jul 16, 1914 DOA: 03/09/2017 PCP: Patient, No Pcp Per  Brief Narrative:[name]  is a 84 y.o. male, w hypertension, Bph s/p remote TURP, CKD who apparently presents w complaints of voiding difficult for the past several months. And has been unable to void for the past several days other than some minor dribbling.  In ED, Pt had >2040mL in his bladder on scan. Urology was able to place foley w cystoscopy with dilation of bladder neck contracture.  CT showed is massively distended urinary bladder with moderate right and left hydronephrosis, creatinine on admission was 12.3, BUN of 145 Assessment & Plan:   Principal Problem:   ARF (acute renal failure) (HCC) -Baseline creatinine was 2.5 in July/2018 -Postobstructive due to severe BPH and obstruction  -appreciate urology consult, remote history of TURP -Had a Foley catheter placed under cystoscopy with dilation of bladder neck contact last night -Will likely need Foley catheter for several weeks per Urology recommendation with close follow-up -Monitor urine output closely, expect some degree of postobstructive diuresis -Continue IV fluids, normal saline and controlled today, and then replaced based on output -Monitor Bmet daily    Hyperkalemia -Due to acute renal failure, corrected with Kayexalate  Elevated troponin -Due to demand from acute renal failure, and decreased renal clearance of troponin -No symptoms of chest pain or dyspnea at this time -Due to advanced age, and being asymptomatic from this will not pursue further cardiac workup at this time -Add aspirin  Possible urinary tract infection -Continue ceftriaxone, follow-up urine culture, leave Foley catheter at discharge  DVT prophylaxis: heparin subcutaneous  Code Status: Rediscussed CODE STATUS he is agreeable to DO NOT RESUSCITATE  Family Communication: no family at bedside  Disposition Plan:  home pending improvement  in renal failure  Consultants:   Urology    Procedures: 1/21:  foley w cystoscopy with dilation of bladder neck contracture  Antimicrobials:  Antibiotics Given (last 72 hours)    Date/Time Action Medication Dose Rate   03/12/17 0108 New Bag/Given   cefTRIAXone (ROCEPHIN) 1 g in dextrose 5 % 50 mL IVPB 1 g 100 mL/hr       Subjective: -feels okay, no specific complaints at this time denies any chest pain or shortness of breath,   -realizes that he has a catheter in now   Objecta ive: Vitals:   03/12/17 0343 03/12/17 0344 03/12/17 0415 03/12/17 0800  BP: (!) 127/49     Pulse:  (!) 102 (!) 104   Resp:      Temp:    (!) 97.5 F (36.4 C)  TempSrc:    Axillary  SpO2:  100% 100%   Weight:      Height:        Intake/Output Summary (Last 24 hours) at 03/12/2017 1128 Last data filed at 03/12/2017 0845 Gross per 24 hour  Intake 2230 ml  Output 1400 ml  Net 830 ml   Filed Weights   03/21/2017 1757  Weight: 56.7 kg (125 lb)    Examination:  General exam: Appears calm and comfortable, elderly, hard of hearing    Respiratory system: clear bilaterally  Cardiovascular system: S1 & S2 heard,  Gastrointestinal system: Abdomen is nondistended, soft and nontender.Normal bowel sounds heard. Central nervous system: Alert and oriented. No focal neurological deficits. Extremities: Symmetric 5 x 5 power. Skin: No rashes Psychiatry: Flat affect      Data Reviewed:   CBC: Recent Labs  Lab 03/05/2017 1751  03/05/2017 1804 03/12/17 0659  WBC 18.1*  --  13.2*  HGB 11.2* 12.2* 9.1*  HCT 32.1* 36.0* 26.3*  MCV 97.0  --  95.3  PLT 289  --  174   Basic Metabolic Panel: Recent Labs  Lab 03/19/2017 1751 03/10/2017 1804 03/12/17 0102 03/12/17 0659  NA 139 137 141 142  K 6.2* 6.0* 5.2* 4.5  CL 104 108 108 108  CO2 18*  --  17* 20*  GLUCOSE 94 89 91 95  BUN 145* 127* 144* 129*  CREATININE 12.34* 12.90* 11.39* 9.74*  CALCIUM 8.9  --  8.5* 8.2*   GFR: Estimated Creatinine  Clearance: 3.2 mL/min (A) (by C-G formula based on SCr of 9.74 mg/dL (H)). Liver Function Tests: Recent Labs  Lab 03/12/17 0659  AST 41  ALT 20  ALKPHOS 63  BILITOT 0.9  PROT 5.9*  ALBUMIN 2.5*   No results for input(s): LIPASE, AMYLASE in the last 168 hours. No results for input(s): AMMONIA in the last 168 hours. Coagulation Profile: No results for input(s): INR, PROTIME in the last 168 hours. Cardiac Enzymes: Recent Labs  Lab 03/12/17 0659  TROPONINI 1.68*   BNP (last 3 results) No results for input(s): PROBNP in the last 8760 hours. HbA1C: No results for input(s): HGBA1C in the last 72 hours. CBG: No results for input(s): GLUCAP in the last 168 hours. Lipid Profile: No results for input(s): CHOL, HDL, LDLCALC, TRIG, CHOLHDL, LDLDIRECT in the last 72 hours. Thyroid Function Tests: No results for input(s): TSH, T4TOTAL, FREET4, T3FREE, THYROIDAB in the last 72 hours. Anemia Panel: No results for input(s): VITAMINB12, FOLATE, FERRITIN, TIBC, IRON, RETICCTPCT in the last 72 hours. Urine analysis:    Component Value Date/Time   COLORURINE RED (A) 03/07/2017 2322   APPEARANCEUR CLOUDY (A) 03/20/2017 2322   LABSPEC 1.015 03/01/2017 2322   PHURINE 5.0 02/22/2017 2322   GLUCOSEU 50 (A) 03/18/2017 2322   HGBUR LARGE (A) 02/19/2017 2322   BILIRUBINUR NEGATIVE 02/20/2017 2322   KETONESUR NEGATIVE 02/21/2017 2322   PROTEINUR 100 (A) 03/17/2017 2322   UROBILINOGEN 0.2 06/10/2013 0933   NITRITE NEGATIVE 03/06/2017 2322   LEUKOCYTESUR LARGE (A) 02/21/2017 2322   Sepsis Labs: @LABRCNTIP (procalcitonin:4,lacticidven:4)  ) Recent Results (from the past 240 hour(s))  MRSA PCR Screening     Status: None   Collection Time: 03/12/17  6:44 AM  Result Value Ref Range Status   MRSA by PCR NEGATIVE NEGATIVE Final    Comment:        The GeneXpert MRSA Assay (FDA approved for NASAL specimens only), is one component of a comprehensive MRSA colonization surveillance program. It is  not intended to diagnose MRSA infection nor to guide or monitor treatment for MRSA infections.          Radiology Studies: Ct Abdomen Pelvis Wo Contrast  Result Date: 03/21/2017 CLINICAL DATA:  increased weakness, requiring assistance to perform ADL's, new onset of incontinence. Per EMS pt is Alert and oriented x 4 EXAM: CT ABDOMEN AND PELVIS WITHOUT CONTRAST TECHNIQUE: Multidetector CT imaging of the abdomen and pelvis was performed following the standard protocol without IV contrast. COMPARISON:  10/10/2009 FINDINGS: Lower chest: Coronary and aortic atheromatous calcifications. No pleural or pericardial effusion. Calcified granuloma in the lateral right middle lobe. Subpleural scarring in the lung bases. Hepatobiliary: No focal liver abnormality is seen. No gallstones, gallbladder wall thickening, or biliary dilatation. Pancreas: Diffuse atrophy without mass or ductal dilatation. Spleen: Calcified granulomas.  Normal in size. Adrenals/Urinary Tract: Normal adrenal  glands. Moderate right hydronephrosis and ureterectasis seen down to the level of the SI joint without radiodense calculus. There is also mild left hydronephrosis and proximal ureterectasis seen down to the level of the SI joint. The urinary bladder is markedly distended extending above the level of the umbilicus. 2.4 cm mid right low-attenuation lesion probably cyst. No urolithiasis. Stomach/Bowel: Stomach is nondilated. Small bowel decompressed. Appendix not discretely identified. Colon is nondilated with no wall thickening or adjacent inflammatory/edematous change. Anastomotic staple line noted in the mid abdomen as before. Vascular/Lymphatic: Heavy aortoiliac atheromatous calcifications. No aneurysm. No abdominal or pelvic adenopathy localized. Bilateral pelvic phleboliths. Reproductive: Prostate is unremarkable. Other: No ascites.  No free air. Musculoskeletal: Spondylitic changes in the lower thoracic and lumbar spine. Postop change  L4-5. Normal alignment. Negative for fracture or worrisome bone lesion. 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 IMPRESSION: 1. Massive distention of the urinary bladder with moderate right and mild left hydronephrosis. 2. Coronary and aortoiliac atherosclerosis. Electronically Signed   By: Lucrezia Europe M.D.   On: 03/02/2017 18:58   Ct Head Wo Contrast  Result Date: 03/12/2017 CLINICAL DATA:  Increasing weakness over the past few days. EXAM: CT HEAD WITHOUT CONTRAST TECHNIQUE: Contiguous axial images were obtained from the base of the skull through the vertex without intravenous contrast. COMPARISON:  None. FINDINGS: Brain: There is some cortical atrophy and chronic microvascular ischemic change. No evidence of acute abnormality including hemorrhage, infarct, mass lesion, mass effect, midline shift or abnormal extra-axial fluid collection. No hydrocephalus or pneumocephalus. Vascular: Atherosclerosis noted. Skull: Intact. Sinuses/Orbits: Negative. Other: None. IMPRESSION: No acute abnormality. Atrophy and chronic microvascular ischemic change. Atherosclerosis. Electronically Signed   By: Inge Rise M.D.   On: 03/10/2017 18:52   Dg Abdomen Acute W/chest  Result Date: 03/15/2017 CLINICAL DATA:  Increasing weakness. EXAM: DG ABDOMEN ACUTE W/ 1V CHEST COMPARISON:  PA and lateral chest 10/13/2014 and 06/11/2013. FINDINGS: The lungs are emphysematous. Since the prior plain films, there has been an increase in coarse opacities in the upper lung zones bilaterally, worse on the left. Scarring in the left base and a calcified granuloma in the right lung base appear unchanged. Heart size is normal. Atherosclerosis noted. Two views of the abdomen show no free intraperitoneal air. There is mild gaseous distention of small and large bowel most compatible with ileus. No acute bony abnormality or unexpected abdominal calcification is seen. IMPRESSION: Mild gaseous distention of small and large bowel most consistent with ileus. Emphysema.  Coarse opacities in the upper lung zones bilaterally likely reflect progressive scarring and fibrosis since 2016. Electronically Signed   By: Inge Rise M.D.   On: 03/05/2017 18:49        Scheduled Meds: . atorvastatin  80 mg Oral q1800  . heparin  5,000 Units Subcutaneous Q8H  . latanoprost  1 drop Both Eyes QHS  . metoprolol succinate  12.5 mg Oral BID   Continuous Infusions: . sodium chloride 100 mL/hr at 03/12/17 0845  . cefTRIAXone (ROCEPHIN)  IV Stopped (03/12/17 0138)  .  sodium bicarbonate (isotonic) infusion in sterile water 75 mL/hr at 03/12/17 0642     LOS: 1 day    Time spent: 10min    Domenic Polite, MD [facility] Page via [email or web address], password [email or web address] After 7PM please contact night-coverage  03/12/2017, 11:28 AM

## 2017-03-12 NOTE — ED Notes (Signed)
Gave report to [facility]. RN

## 2017-03-12 NOTE — Progress Notes (Signed)
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  His Cr is falling slowly and the foley continues to drain.  The urine is blood tinged and his UA was suggestive of UTI.  A culture is pending.   ROS:  Review of Systems  Unable to perform ROS: Dementia    Anti-infectives: Anti-infectives (From admission, onward)   Start     Dose/Rate Route Frequency Ordered Stop   03/12/17 0015  cefTRIAXone (ROCEPHIN) 1 g in dextrose 5 % 50 mL IVPB     1 g 100 mL/hr over 30 Minutes Intravenous Every 24 hours 03/12/17 0009        Current Facility-Administered Medications  Medication Dose Route Frequency Provider Last Rate Last Dose  . 0.9 %  sodium chloride infusion   Intravenous Continuous Domenic Polite, MD      . acetaminophen (TYLENOL) tablet 650 mg  650 mg Oral Q6H PRN Jani Gravel, MD       Or  . acetaminophen (TYLENOL) suppository 650 mg  650 mg Rectal Q6H PRN Jani Gravel, MD      . atorvastatin (LIPITOR) tablet 80 mg  80 mg Oral q1800 Jani Gravel, MD      . cefTRIAXone (ROCEPHIN) 1 g in dextrose 5 % 50 mL IVPB  1 g Intravenous Q24H Jani Gravel, MD   Stopped at 03/12/17 0138  . heparin injection 5,000 Units  5,000 Units Subcutaneous Q8H Jani Gravel, MD   5,000 Units at 03/12/17 0756  . latanoprost (XALATAN) 0.005 % ophthalmic solution 1 drop  1 drop Both Eyes QHS Jani Gravel, MD      . metoprolol succinate (TOPROL-XL) 24 hr tablet 12.5 mg  12.5 mg Oral BID Jani Gravel, MD   12.5 mg at 03/12/17 0758  . sodium bicarbonate 150 mEq in sterile water 1,000 mL infusion   Intravenous Continuous Duffy Bruce, MD 75 mL/hr at 03/12/17 2725       Objective: Vital signs in last 24 hours: Temp:  [96.2 F (35.7 C)-98.3 F (36.8 C)] 97.5 F (36.4 C) (01/22 0800) Pulse Rate:  [74-104] 104 (01/22 0415) Resp:  [11-26] 11 (01/22 0215) BP: (105-165)/(49-126) 127/49 (01/22 0343) SpO2:  [91 %-100 %] 100 % (01/22 0415) Weight:  [56.7 kg (125 lb)] 56.7 kg (125 lb) (01/21  1757)  Intake/Output from previous day: 01/21 0701 - 01/22 0700 In: 2110 [IV Piggyback:2110] Out: 1400 [Urine:1400] Intake/Output this shift: No intake/output data recorded.   Physical Exam  Constitutional: He is well-developed, well-nourished, and in no distress.  Genitourinary:  Genitourinary Comments: Urine is blood tinged.   Vitals reviewed.   Lab Results:  Recent Labs    03/07/2017 1751 03/12/2017 1804 03/12/17 0659  WBC 18.1*  --  13.2*  HGB 11.2* 12.2* 9.1*  HCT 32.1* 36.0* 26.3*  PLT 289  --  240   BMET Recent Labs    03/12/17 0102 03/12/17 0659  NA 141 142  K 5.2* 4.5  CL 108 108  CO2 17* 20*  GLUCOSE 91 95  BUN 144* PENDING  CREATININE 11.39* 9.74*  CALCIUM 8.5* 8.2*   PT/INR No results for input(s): LABPROT, INR in the last 72 hours. ABG No results for input(s): PHART, HCO3 in the last 72 hours.  Invalid input(s): PCO2, PO2  Studies/Results: Ct Abdomen Pelvis Wo Contrast  Result Date: 02/27/2017 CLINICAL DATA:  increased weakness, requiring assistance to perform ADL's, new onset of incontinence. Per EMS pt is Alert and oriented x 4 EXAM: CT ABDOMEN  AND PELVIS WITHOUT CONTRAST TECHNIQUE: Multidetector CT imaging of the abdomen and pelvis was performed following the standard protocol without IV contrast. COMPARISON:  10/10/2009 FINDINGS: Lower chest: Coronary and aortic atheromatous calcifications. No pleural or pericardial effusion. Calcified granuloma in the lateral right middle lobe. Subpleural scarring in the lung bases. Hepatobiliary: No focal liver abnormality is seen. No gallstones, gallbladder wall thickening, or biliary dilatation. Pancreas: Diffuse atrophy without mass or ductal dilatation. Spleen: Calcified granulomas.  Normal in size. Adrenals/Urinary Tract: Normal adrenal glands. Moderate right hydronephrosis and ureterectasis seen down to the level of the SI joint without radiodense calculus. There is also mild left hydronephrosis and proximal  ureterectasis seen down to the level of the SI joint. The urinary bladder is markedly distended extending above the level of the umbilicus. 2.4 cm mid right low-attenuation lesion probably cyst. No urolithiasis. Stomach/Bowel: Stomach is nondilated. Small bowel decompressed. Appendix not discretely identified. Colon is nondilated with no wall thickening or adjacent inflammatory/edematous change. Anastomotic staple line noted in the mid abdomen as before. Vascular/Lymphatic: Heavy aortoiliac atheromatous calcifications. No aneurysm. No abdominal or pelvic adenopathy localized. Bilateral pelvic phleboliths. Reproductive: Prostate is unremarkable. Other: No ascites.  No free air. Musculoskeletal: Spondylitic changes in the lower thoracic and lumbar spine. Postop change L4-5. Normal alignment. Negative for fracture or worrisome bone lesion. 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 IMPRESSION: 1. Massive distention of the urinary bladder with moderate right and mild left hydronephrosis. 2. Coronary and aortoiliac atherosclerosis. Electronically Signed   By: Lucrezia Europe M.D.   On: 02/19/2017 18:58   Ct Head Wo Contrast  Result Date: 03/14/2017 CLINICAL DATA:  Increasing weakness over the past few days. EXAM: CT HEAD WITHOUT CONTRAST TECHNIQUE: Contiguous axial images were obtained from the base of the skull through the vertex without intravenous contrast. COMPARISON:  None. FINDINGS: Brain: There is some cortical atrophy and chronic microvascular ischemic change. No evidence of acute abnormality including hemorrhage, infarct, mass lesion, mass effect, midline shift or abnormal extra-axial fluid collection. No hydrocephalus or pneumocephalus. Vascular: Atherosclerosis noted. Skull: Intact. Sinuses/Orbits: Negative. Other: None. IMPRESSION: No acute abnormality. Atrophy and chronic microvascular ischemic change. Atherosclerosis. Electronically Signed   By: Inge Rise M.D.   On: 02/20/2017 18:52   Dg Abdomen Acute W/chest  Result  Date: 03/19/2017 CLINICAL DATA:  Increasing weakness. EXAM: DG ABDOMEN ACUTE W/ 1V CHEST COMPARISON:  PA and lateral chest 10/13/2014 and 06/11/2013. FINDINGS: The lungs are emphysematous. Since the prior plain films, there has been an increase in coarse opacities in the upper lung zones bilaterally, worse on the left. Scarring in the left base and a calcified granuloma in the right lung base appear unchanged. Heart size is normal. Atherosclerosis noted. Two views of the abdomen show no free intraperitoneal air. There is mild gaseous distention of small and large bowel most compatible with ileus. No acute bony abnormality or unexpected abdominal calcification is seen. IMPRESSION: Mild gaseous distention of small and large bowel most consistent with ileus. Emphysema. Coarse opacities in the upper lung zones bilaterally likely reflect progressive scarring and fibrosis since 2016. Electronically Signed   By: Inge Rise M.D.   On: 02/27/2017 18:49     Assessment and Plan: Urinary retention from a bladder neck contraction with AKI.   His Cr is falling with decompression.  He will need f/u with either me or Dr. Tresa Endo at [facility] who he has seen in the past, about 2 weeks post discharge.       LOS: 1 day  Irine Seal 03/12/2017 [phone] ID: Ronald Norris, male   DOB: Sep 09, 1914, 84 y.o.   MRN: [id number]

## 2017-03-12 NOTE — Progress Notes (Signed)
CRITICAL VALUE ALERT  Critical Value:  Lactic 5.0  Date & Time Notied:  03/06/2017 2353  Provider Notified: Tylene Fantasia  Orders Received/Actions taken: Pt on comfort care only

## 2017-03-12 NOTE — Care Management Note (Signed)
Case Management Note  Patient Details  Name: Ronald Norris MRN: [id number] Date of Birth: 1914-05-14  Subjective/Objective:                  Urosepsis in a 84 year old/lives at home  Action/Plan: Date: March 09, 2017 Velva Harman, BSN, [place], [place] Chart and notes review for patient progress and needs. Will follow for case management and discharge needs. No cm or discharge needs present at time of this review. Next review date: [id number]  Expected Discharge Date:                  Expected Discharge Plan:  Home/Self Care  In-House Referral:     Discharge planning Services  CM Consult  Post Acute Care Choice:    Choice offered to:     DME Arranged:    DME Agency:     HH Arranged:    HH Agency:     Status of Service:  In process, will continue to follow  If discussed at Long Length of Stay Meetings, dates discussed:    Additional Comments:  Leeroy Cha, RN 03/12/2017, 8:57 AM

## 2017-03-12 NOTE — Discharge Instructions (Signed)
Indwelling Urinary Catheter Care, Adult  Take good care of your catheter to keep it working and to prevent problems.  How to wear your catheter  Attach your catheter to your leg with tape (adhesive tape) or a leg strap. Make sure it is not too tight. If you use tape, remove any bits of tape that are already on the catheter.  How to wear a drainage bag  You should have:   A large overnight bag.   A small leg bag.    Overnight Bag  You may wear the overnight bag at any time. Always keep the bag below the level of your bladder but off the floor. When you sleep, put a clean plastic bag in a wastebasket. Then hang the bag inside the wastebasket.  Leg Bag  Never wear the leg bag at night. Always wear the leg bag below your knee. Keep the leg bag secure with a leg strap or tape.  How to care for your skin   Clean the skin around the catheter at least once every day.   Shower every day. Do not take baths.   Put creams, lotions, or ointments on your genital area only as told by your doctor.   Do not use powders, sprays, or lotions on your genital area.  How to clean your catheter and your skin  1. Wash your hands with soap and water.  2. Wet a washcloth in warm water and gentle (mild) soap.  3. Use the washcloth to clean the skin where the catheter enters your body. Clean downward and wipe away from the catheter in small circles. Do not wipe toward the catheter.  4. Pat the area dry with a clean towel. Make sure to clean off all soap.  How to care for your drainage bags  Empty your drainage bag when it is ?- full or at least 2-3 times a day. Replace your drainage bag once a month or sooner if it starts to smell bad or look dirty. Do not clean your drainage bag unless told by your doctor.  Emptying a drainage bag    Supplies Needed   Rubbing alcohol.   Gauze pad or cotton ball.   Tape or a leg strap.    Steps  1. Wash your hands with soap and water.  2. Separate (detach) the bag from your leg.  3. Hold the bag over  the toilet or a clean container. Keep the bag below your hips and bladder. This stops pee (urine) from going back into the tube.  4. Open the pour spout at the bottom of the bag.  5. Empty the pee into the toilet or container. Do not let the pour spout touch any surface.  6. Put rubbing alcohol on a gauze pad or cotton ball.  7. Use the gauze pad or cotton ball to clean the pour spout.  8. Close the pour spout.  9. Attach the bag to your leg with tape or a leg strap.  10. Wash your hands.    Changing a drainage bag  Supplies Needed   Alcohol wipes.   A clean drainage bag.   Adhesive tape or a leg strap.    Steps  1. Wash your hands with soap and water.  2. Separate the dirty bag from your leg.  3. Pinch the rubber catheter with your fingers so that pee does not spill out.  4. Separate the catheter tube from the drainage tube where these tubes connect (at the   connection valve). Do not let the tubes touch any surface.  5. Clean the end of the catheter tube with an alcohol wipe. Use a different alcohol wipe to clean the end of the drainage tube.  6. Connect the catheter tube to the drainage tube of the clean bag.  7. Attach the new bag to the leg with adhesive tape or a leg strap.  8. Wash your hands.    How to prevent infection and other problems   Never pull on your catheter or try to remove it. Pulling can damage tissue in your body.   Always wash your hands before and after touching your catheter.   If a leg strap gets wet, replace it with a dry one.   Drink enough fluids to keep your pee clear or pale yellow, or as told by your doctor.   Do not let the drainage bag or tubing touch the floor.   Wear cotton underwear.   If you are male, wipe from front to back after you poop (have a bowel movement).   Check on the catheter often to make sure it works and the tubing is not twisted.  Get help if:   Your pee is cloudy.   Your pee smells unusually bad.   Your pee is not draining into the bag.   Your  tube gets clogged.   Your catheter starts to leak.   Your bladder feels full.  Get help right away if:   You have redness, swelling, or pain where the catheter enters your body.   You have fluid, pus, or a bad smell coming from the area where the catheter enters your body.   The area where the catheter enters your body feels warm.   You have a fever.   You have pain in your:  ? Stomach (abdomen).  ? Legs.  ? Lower back.  ? Bladder.   You see blood fill the catheter.   Your pee is pink or red.   You feel sick to your stomach (nauseous).   You throw up (vomit).   You have chills.   Your catheter gets pulled out.  This information is not intended to replace advice given to you by your health care provider. Make sure you discuss any questions you have with your health care provider.  Document Released: 06/02/2012 Document Revised: 01/04/2016 Document Reviewed: 07/21/2013  [employer]vier Interactive Patient Education  2018 [employer]

## 2017-03-12 NOTE — Progress Notes (Signed)
  Echocardiogram 2D Echocardiogram has been performed.  Perlie Scheuring L Androw 03/12/2017, 2:22 PM

## 2017-03-13 ENCOUNTER — Inpatient Hospital Stay (HOSPITAL_COMMUNITY): Payer: Medicare Other

## 2017-03-13 LAB — BASIC METABOLIC PANEL
ANION GAP: 18 — AB (ref 5–15)
BUN: 118 mg/dL — ABNORMAL HIGH (ref 6–20)
CO2: 19 mmol/L — AB (ref 22–32)
Calcium: 8.3 mg/dL — ABNORMAL LOW (ref 8.9–10.3)
Chloride: 108 mmol/L (ref 101–111)
Creatinine, Ser: 6.97 mg/dL — ABNORMAL HIGH (ref 0.61–1.24)
GFR calc Af Amer: 7 mL/min — ABNORMAL LOW (ref >60)
GFR calc non Af Amer: 6 mL/min — ABNORMAL LOW (ref >60)
GLUCOSE: 109 mg/dL — AB (ref 65–99)
POTASSIUM: 4.1 mmol/L (ref 3.5–5.1)
Sodium: 145 mmol/L (ref 135–145)

## 2017-03-13 LAB — URINE CULTURE
Culture: NO GROWTH
Special Requests: NORMAL

## 2017-03-13 LAB — CBC
HEMATOCRIT: 28 % — AB (ref 39.0–52.0)
HEMOGLOBIN: 9.6 g/dL — AB (ref 13.0–17.0)
MCH: 33.1 pg (ref 26.0–34.0)
MCHC: 34.3 g/dL (ref 30.0–36.0)
MCV: 96.6 fL (ref 78.0–100.0)
Platelets: 271 10*3/uL (ref 150–400)
RBC: 2.9 MIL/uL — AB (ref 4.22–5.81)
RDW: 14.8 % (ref 11.5–15.5)
WBC: 17.7 10*3/uL — ABNORMAL HIGH (ref 4.0–10.5)

## 2017-03-13 MED ORDER — PIPERACILLIN-TAZOBACTAM IN DEX 2-0.25 GM/50ML IV SOLN
2.2500 g | Freq: Three times a day (TID) | INTRAVENOUS | Status: DC
  Administered 2017-03-13: 2.25 g via INTRAVENOUS
  Filled 2017-03-13 (×3): qty 50

## 2017-03-13 MED ORDER — FUROSEMIDE 10 MG/ML IJ SOLN
20.0000 mg | Freq: Once | INTRAMUSCULAR | Status: AC
  Administered 2017-03-13: 20 mg via INTRAVENOUS
  Filled 2017-03-13: qty 2

## 2017-03-22 NOTE — Progress Notes (Signed)
PROGRESS NOTE    Ronald Norris  XBM:[id number] DOB: 04-11-1914 DOA: 03/07/2017 PCP: Patient, No Pcp Per   Brief Narrative:  Patient is a 84 y.o. y/o male presents to ER with unable to void, has a significant past medical history of benign prostate hypertrophy, hypertension, chronic kidney disease stage 4.  He was brought to ED for evaluation.  Bladder scan shows over 2,022mL fluid in bladder and foley was placed by urology.  On initial physical examination blood pressure 141/100, pulse 76, temperature 96.70F, respiratory 22, SpO2 97% on room air, sodium 139, potassium 6.2, chloride 6.2, bicarb 18, BUN 145, creatinine 12.34, WBC 18.1.  Patient was admitted to the hospital with working diagnosis of acute renal failure likely due to post-renal obstruction  Assessment & Plan:   Principal Problem:   ARF (acute renal failure) (HCC) Active Problems:   Hyperkalemia   1. ARF secondary to post-renal obstruction  - Output over 2,136mL since foley in place. BUN improved to 118, creatinine improved to 6.97. Continue IVF 155mL/hr and order BMP to monitor renal function.  Will order chest x-ray to confirm no lung involvement  2. Hyperkalemia - Potassium normalized to 4.1.  Plan as above  3. Metabolic encephalopathy - Likely due to uremia from post-renal obstruction. BUN improved 145 to 118. Plan as above  4. UTI - Urine analysis shows cloudy, red, large leukocytes on admission. Urine culture shows no growth. WBC 13.2, 17.7. Patient is afebrile.  Will continue ceftriaxone for possible UTI.     DVT prophylaxis: Heparin subcutaneous Code Status:  DNR  Family Communication:  Family at bedside with all questions answered  Disposition Plan:  Home   Consultants:     Procedures: Antimicrobials: Ceftriaxone  Subjective: Patient appears deconditioned, alert, confused. He is unable to provide history.  History obtained from family members. Denies chest pain, trouble breathing, pain overall.   Family reports since last Saturday, he has been somnolent, confused without any clear cause.   Objective: Vitals:   03/12/17 2002 03/12/17 2100 03/26/17 0353 03/26/17 1156  BP:  119/60 (!) 120/56 102/60  Pulse:  (!) 101 (!) 120 74  Resp:  17 18 18   Temp: 97.6 F (36.4 C) 98.1 F (36.7 C) 98.2 F (36.8 C) 98.4 F (36.9 C)  TempSrc: Axillary Axillary Oral Axillary  SpO2:  (!) 89% 98% 97%  Weight:  54.3 kg (119 lb 9.6 oz)    Height:        Intake/Output Summary (Last 24 hours) at 03/26/17 1246 Last data filed at 03/26/2017 0200 Gross per 24 hour  Intake 1945 ml  Output 200 ml  Net 1745 ml   Filed Weights   02/28/2017 1757 03/12/17 2100  Weight: 56.7 kg (125 lb) 54.3 kg (119 lb 9.6 oz)    Examination:  General exam: Appears deconditioned, calm E ENT: No icterus, oral mucosa moist Respiratory system: Clear to auscultation. Tachypneic  No wheezing or crackle Cardiovascular system: S1 & S2 heard, RRR.  No pedal edema. Gastrointestinal system: Abdomen is nondistended, soft and nontender. Normal bowel sounds heard. Central nervous system: Alert. Confused. No focal neurological deficits. Musculoskeletal/Extremities:  No joint deformities  Skin: No rashes, lesions or ulcers. Dry and warm to touch   Data Reviewed: I have personally reviewed following labs and imaging studies  CBC: Recent Labs  Lab 03/18/2017 1751 03/16/2017 1804 03/12/17 0659 03-26-2017 0406  WBC 18.1*  --  13.2* 17.7*  HGB 11.2* 12.2* 9.1* 9.6*  HCT 32.1* 36.0* 26.3* 28.0*  MCV 97.0  --  95.3 96.6  PLT 289  --  240 299   Basic Metabolic Panel: Recent Labs  Lab 03/01/2017 1751 03/01/2017 1804 03/12/17 0102 03/12/17 0659 Mar 14, 2017 0406  NA 139 137 141 142 145  K 6.2* 6.0* 5.2* 4.5 4.1  CL 104 108 108 108 108  CO2 18*  --  17* 20* 19*  GLUCOSE 94 89 91 95 109*  BUN 145* 127* 144* 129* 118*  CREATININE 12.34* 12.90* 11.39* 9.74* 6.97*  CALCIUM 8.9  --  8.5* 8.2* 8.3*   GFR: Estimated Creatinine  Clearance: 4.3 mL/min (A) (by C-G formula based on SCr of 6.97 mg/dL (H)). Liver Function Tests: Recent Labs  Lab 03/12/17 0659  AST 41  ALT 20  ALKPHOS 63  BILITOT 0.9  PROT 5.9*  ALBUMIN 2.5*   No results for input(s): LIPASE, AMYLASE in the last 168 hours. No results for input(s): AMMONIA in the last 168 hours. Coagulation Profile: No results for input(s): INR, PROTIME in the last 168 hours. Cardiac Enzymes: Recent Labs  Lab 03/12/17 0659  TROPONINI 1.68*   BNP (last 3 results) No results for input(s): PROBNP in the last 8760 hours. HbA1C: No results for input(s): HGBA1C in the last 72 hours. CBG: No results for input(s): GLUCAP in the last 168 hours. Lipid Profile: No results for input(s): CHOL, HDL, LDLCALC, TRIG, CHOLHDL, LDLDIRECT in the last 72 hours. Thyroid Function Tests: No results for input(s): TSH, T4TOTAL, FREET4, T3FREE, THYROIDAB in the last 72 hours. Anemia Panel: No results for input(s): VITAMINB12, FOLATE, FERRITIN, TIBC, IRON, RETICCTPCT in the last 72 hours. Sepsis Labs: Recent Labs  Lab 02/21/2017 1804  LATICACIDVEN 1.29    Recent Results (from the past 240 hour(s))  Urine culture     Status: None   Collection Time: 03/16/2017 11:22 PM  Result Value Ref Range Status   Specimen Description URINE, CATHETERIZED  Final   Special Requests Normal  Final   Culture   Final    NO GROWTH Performed at [facility], [address] [place], [place] [address]    Report Status 03-14-17 FINAL  Final  MRSA PCR Screening     Status: None   Collection Time: 03/12/17  6:44 AM  Result Value Ref Range Status   MRSA by PCR NEGATIVE NEGATIVE Final    Comment:        The GeneXpert MRSA Assay (FDA approved for NASAL specimens only), is one component of a comprehensive MRSA colonization surveillance program. It is not intended to diagnose MRSA infection nor to guide or monitor treatment for MRSA infections.          Radiology Studies: Ct  Abdomen Pelvis Wo Contrast  Result Date: 02/23/2017 CLINICAL DATA:  increased weakness, requiring assistance to perform ADL's, new onset of incontinence. Per EMS pt is Alert and oriented x 4 EXAM: CT ABDOMEN AND PELVIS WITHOUT CONTRAST TECHNIQUE: Multidetector CT imaging of the abdomen and pelvis was performed following the standard protocol without IV contrast. COMPARISON:  10/10/2009 FINDINGS: Lower chest: Coronary and aortic atheromatous calcifications. No pleural or pericardial effusion. Calcified granuloma in the lateral right middle lobe. Subpleural scarring in the lung bases. Hepatobiliary: No focal liver abnormality is seen. No gallstones, gallbladder wall thickening, or biliary dilatation. Pancreas: Diffuse atrophy without mass or ductal dilatation. Spleen: Calcified granulomas.  Normal in size. Adrenals/Urinary Tract: Normal adrenal glands. Moderate right hydronephrosis and ureterectasis seen down to the level of the SI joint without radiodense calculus. There  is also mild left hydronephrosis and proximal ureterectasis seen down to the level of the SI joint. The urinary bladder is markedly distended extending above the level of the umbilicus. 2.4 cm mid right low-attenuation lesion probably cyst. No urolithiasis. Stomach/Bowel: Stomach is nondilated. Small bowel decompressed. Appendix not discretely identified. Colon is nondilated with no wall thickening or adjacent inflammatory/edematous change. Anastomotic staple line noted in the mid abdomen as before. Vascular/Lymphatic: Heavy aortoiliac atheromatous calcifications. No aneurysm. No abdominal or pelvic adenopathy localized. Bilateral pelvic phleboliths. Reproductive: Prostate is unremarkable. Other: No ascites.  No free air. Musculoskeletal: Spondylitic changes in the lower thoracic and lumbar spine. Postop change L4-5. Normal alignment. Negative for fracture or worrisome bone lesion. 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 IMPRESSION: 1. Massive distention of the  urinary bladder with moderate right and mild left hydronephrosis. 2. Coronary and aortoiliac atherosclerosis. Electronically Signed   By: Lucrezia Europe M.D.   On: 03/08/2017 18:58   Ct Head Wo Contrast  Result Date: 02/27/2017 CLINICAL DATA:  Increasing weakness over the past few days. EXAM: CT HEAD WITHOUT CONTRAST TECHNIQUE: Contiguous axial images were obtained from the base of the skull through the vertex without intravenous contrast. COMPARISON:  None. FINDINGS: Brain: There is some cortical atrophy and chronic microvascular ischemic change. No evidence of acute abnormality including hemorrhage, infarct, mass lesion, mass effect, midline shift or abnormal extra-axial fluid collection. No hydrocephalus or pneumocephalus. Vascular: Atherosclerosis noted. Skull: Intact. Sinuses/Orbits: Negative. Other: None. IMPRESSION: No acute abnormality. Atrophy and chronic microvascular ischemic change. Atherosclerosis. Electronically Signed   By: Inge Rise M.D.   On: 03/05/2017 18:52   Dg Chest Port 1 View  Result Date: 04/02/17 CLINICAL DATA:  Weakness, rhonchi, former smoker, hypertension EXAM: PORTABLE CHEST 1 VIEW COMPARISON:  Portable exam 1131 hours compared to 03/06/2017 and 06/10/2013 FINDINGS: Enlargement of cardiac silhouette. Atherosclerotic calcification aorta. Diffuse BILATERAL pulmonary infiltrates in the periphery of the upper lobes, increased versus 06/10/2013, either representing progressive chronic interstitial lung disease or superimposed acute infiltrate. In addition, underlying emphysematous changes with progressive infiltrates are seen in the mid to lower lungs bilaterally greater on LEFT. Small bibasilar pleural effusion. No pneumothorax. Bones demineralized. IMPRESSION: Changes of COPD and component of underlying pulmonary fibrosis greatest in the upper lobes. Increased infiltrates bilaterally especially in the lower LEFT lung compatible with either infection or pulmonary edema. Small  bibasilar pleural effusions. Electronically Signed   By: Lavonia Dana M.D.   On: 04/02/17 12:07   Dg Abdomen Acute W/chest  Result Date: 03/19/2017 CLINICAL DATA:  Increasing weakness. EXAM: DG ABDOMEN ACUTE W/ 1V CHEST COMPARISON:  PA and lateral chest 10/13/2014 and 06/11/2013. FINDINGS: The lungs are emphysematous. Since the prior plain films, there has been an increase in coarse opacities in the upper lung zones bilaterally, worse on the left. Scarring in the left base and a calcified granuloma in the right lung base appear unchanged. Heart size is normal. Atherosclerosis noted. Two views of the abdomen show no free intraperitoneal air. There is mild gaseous distention of small and large bowel most compatible with ileus. No acute bony abnormality or unexpected abdominal calcification is seen. IMPRESSION: Mild gaseous distention of small and large bowel most consistent with ileus. Emphysema. Coarse opacities in the upper lung zones bilaterally likely reflect progressive scarring and fibrosis since 2016. Electronically Signed   By: Inge Rise M.D.   On: 03/04/2017 18:49        Scheduled Meds: . aspirin  81 mg Oral Daily  . heparin  5,000 Units Subcutaneous Q8H  . latanoprost  1 drop Both Eyes QHS  . metoprolol succinate  12.5 mg Oral BID   Continuous Infusions: . cefTRIAXone (ROCEPHIN)  IV Stopped (Apr 05, 2017 0045)     LOS: 2 days    Ronald Elta [place]) Mervyn Skeeters, PA-S   Attending MD note  Patient was seen, examined,treatment plan was discussed with the PA-S.  I have personally reviewed the clinical findings, lab, imaging studies and management of this patient in detail. I agree with the documentation, as recorded by the PA-S  Patient is 84 year old male with history of BPH, hypertension, chronic kidney disease stage IV, early dementia, who was admitted to the hospital on 1/21 with acute urinary retention and renal failure.  Urology was consulted and patient had a Foley catheter  placed.  On Exam: Gen. exam: Drowsy, wakes up when prompted, confused Chest: Bibasilar crackles, no wheezing CVS: S1-S2 regular Abdomen: Soft, nontender and nondistended Neurology: Non-focal Skin: No rash or lesions  Plan  Acute encephalopathy -Likely in the setting of renal failure, patient with mild memory problems at least for the past year per family, cannot fully rule out early dementia -Patient remains confused, more lethargic today  Acute kidney injury on chronic kidney disease stage IV -Likely in the setting of urinary obstruction, his creatinine was as high as 12 on admission.  Following Foley placement and IV fluids his creatinine has now improved into the 6 range.  Discontinue fluids today given worsening respiratory status and concern for pulmonary edema -Patient appears to be uncomfortable with his respiratory status, will give Lasix x1  Acute hypoxic respiratory failure due to pulmonary edema and aspiration pneumonia -Patient with new oxygen requirement, likely in the setting of pulmonary edema.  Per family he was coughing with liquids, likely has a component of aspiration pneumonia -Start Zosyn, discontinue ceftriaxone -Lasix  Acute on chronic systolic CHF -Patient had a 2D echo done on 1/22, showed an EF of 30-35%.  I do not see any prior echoes but suspect chronic component.  He became fluid overloaded after IV fluids now requiring Lasix- -not a candidate for further aggressive workup, family in full agreement  Elevated troponin -No reports of chest pain, EKG shows right bundle branch block which is chronic.  Not a good candidate for aggressive further workup as above  Goals of care -Discussed extensively with patient's family this morning in the afternoon, patient clinical picture is relatively guarded, his kidney function improved some but still remains significantly worse, he is not able to tolerate IV fluids due to pulmonary edema.  Discussed about continuing  treatment with Zosyn for aspiration pneumonia and Lasix for pulmonary edema, however this may worsen again the renal function.  He has little to no p.o. intake over the last couple of days, we discussed if this maintains over the next 24-48 hours patient may be a candidate for residential hospice.  Family does not want the patient to go home with hospice, and they are considering SNF if there is some recovery versus residential hospice if there is none. Consulted palliative care as well today  Time spent: 35 minutes, > 50% bedside  Rest as above  Gusta Marksberry M. Cruzita Lederer, MD [facility] [phone]  If 7PM-7AM, please contact night-coverage [email or web address] Password [email or web address]

## 2017-03-22 NOTE — Progress Notes (Signed)
This RN notified by telemetry that patient's HR was sustaining in 20s. After running to patient's room, patient was turned to his left side, drooling, pale, cool to the touch and unresponsive. No pulse was audible- verified by 2 RNs (Duncan). No family at bedside. Patient a DNR. MD Gherghe notified. MD Maudie Mercury also notified who notified family (wife) and completed death certificate.

## 2017-03-22 NOTE — Discharge Summary (Signed)
Ronald Norris, is a 84 y.o. male  DOB 12/04/1914  MRN [id number].  Admission date:  03/21/2017  Admitting Physician  Jani Gravel, MD  Discharge Date:  04/10/2017   Primary MD  Patient, No Pcp Per   Admission Diagnosis  Urinary retention [R33.9] Encephalopathy [G93.40] Obstructive uropathy [N13.9] AKI (acute kidney injury) ([place]) [N17.9] ARF (acute renal failure) ([place]) [N17.9]   Discharge Diagnosis  Urinary retention [R33.9] Encephalopathy [G93.40] Obstructive uropathy [N13.9] AKI (acute kidney injury) ([place]) [N17.9] ARF (acute renal failure) ([place]) [N17.9]      Principal Problem:   ARF (acute renal failure) (H[facility]) Active Problems:   Hyperkalemia      Past Medical History:  Diagnosis Date  . Bifascicular block   . BPH (benign prostatic hypertrophy)   . Chronic renal insufficiency   . RBBB plus LA hemiblock   . SBO (small bowel obstruction) (H[facility])    PARTIAL  . Systolic hypertension    CHRONIC  . Ventral hernia     Past Surgical History:  Procedure Laterality Date  . APPENDECTOMY    . CARDIOVASCULAR STRESS TEST  04/03/2005   EF 67%  . CATARACT EXTRACTION    . LAPAROTOMY         HPI  from the history and physical done on the day of admission:     Ronald Norris  is a 84 y.o. male, w hypertension, Bph s/p remote TURP, CKD who apparently presents w complaints of voiding difficult for the past several months. And has been unable to void for the past several days other than some minor dribbling.  Pt denies abd pain, fever, chills, n/v, diarrhea, brbpr, dysuria.  Pt was brought to ER for evaluation.    In ED, Pt had >2042mL in his bladder on scan. Urology was able to place foley w cystoscopy with dilation of bladder neck contracture.    CT scan  IMPRESSION: 1. Massive distention of the urinary bladder with moderate right and mild left hydronephrosis. 2. Coronary and aortoiliac  atherosclerosis.  Wbc 18.1, Hgb 11.2, Plt 289 Na 139, K 6.2 Bun 145, Creatinine 12.34 Hco3 18   Trop I 0.42  Pt will be admitted for ARF secondary to obstruction, Hyperkalemia,  Troponin elevation, UTI        Hospital Course:     Pt was admitted for ARF, secondary to obstruction, hyperkalemia and troponin elevation, and uti.  CT showed massively distended urinary bladder.   Pt was seen by urology and foley catheter inserted under cystoscopy.  Pt was treated with rocephin.  Urine culture did not grow out anything. Pt trop mild troponin elevation. Cardiac echo showed cardiomyopathy EF 30-35%.  Earlier today he developed new oxygen requirement.  Pt was thought to have ? Aspiration pneumonia.  Rocephin discontinued and started on zosyn.  Pt was found this evening to be bradycardic and then asystolic.   I notified his wife of his passing  Consults obtained - urology     Major procedures and Radiology Reports - PLEASE review detailed  and final reports for all details, in brief -    Ct Abdomen Pelvis Wo Contrast  Result Date: 03/06/2017 CLINICAL DATA:  increased weakness, requiring assistance to perform ADL's, new onset of incontinence. Per EMS pt is Alert and oriented x 4 EXAM: CT ABDOMEN AND PELVIS WITHOUT CONTRAST TECHNIQUE: Multidetector CT imaging of the abdomen and pelvis was performed following the standard protocol without IV contrast. COMPARISON:  10/10/2009 FINDINGS: Lower chest: Coronary and aortic atheromatous calcifications. No pleural or pericardial effusion. Calcified granuloma in the lateral right middle lobe. Subpleural scarring in the lung bases. Hepatobiliary: No focal liver abnormality is seen. No gallstones, gallbladder wall thickening, or biliary dilatation. Pancreas: Diffuse atrophy without mass or ductal dilatation. Spleen: Calcified granulomas.  Normal in size. Adrenals/Urinary Tract: Normal adrenal glands. Moderate right hydronephrosis and ureterectasis seen down  to the level of the SI joint without radiodense calculus. There is also mild left hydronephrosis and proximal ureterectasis seen down to the level of the SI joint. The urinary bladder is markedly distended extending above the level of the umbilicus. 2.4 cm mid right low-attenuation lesion probably cyst. No urolithiasis. Stomach/Bowel: Stomach is nondilated. Small bowel decompressed. Appendix not discretely identified. Colon is nondilated with no wall thickening or adjacent inflammatory/edematous change. Anastomotic staple line noted in the mid abdomen as before. Vascular/Lymphatic: Heavy aortoiliac atheromatous calcifications. No aneurysm. No abdominal or pelvic adenopathy localized. Bilateral pelvic phleboliths. Reproductive: Prostate is unremarkable. Other: No ascites.  No free air. Musculoskeletal: Spondylitic changes in the lower thoracic and lumbar spine. Postop change L4-5. Normal alignment. Negative for fracture or worrisome bone lesion. 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 IMPRESSION: 1. Massive distention of the urinary bladder with moderate right and mild left hydronephrosis. 2. Coronary and aortoiliac atherosclerosis. Electronically Signed   By: Lucrezia Europe M.D.   On: 03/07/2017 18:58   Ct Head Wo Contrast  Result Date: 03/18/2017 CLINICAL DATA:  Increasing weakness over the past few days. EXAM: CT HEAD WITHOUT CONTRAST TECHNIQUE: Contiguous axial images were obtained from the base of the skull through the vertex without intravenous contrast. COMPARISON:  None. FINDINGS: Brain: There is some cortical atrophy and chronic microvascular ischemic change. No evidence of acute abnormality including hemorrhage, infarct, mass lesion, mass effect, midline shift or abnormal extra-axial fluid collection. No hydrocephalus or pneumocephalus. Vascular: Atherosclerosis noted. Skull: Intact. Sinuses/Orbits: Negative. Other: None. IMPRESSION: No acute abnormality. Atrophy and chronic microvascular ischemic change. Atherosclerosis.  Electronically Signed   By: Inge Rise M.D.   On: 03/06/2017 18:52   Dg Chest Port 1 View  Result Date: 04-12-17 CLINICAL DATA:  Weakness, rhonchi, former smoker, hypertension EXAM: PORTABLE CHEST 1 VIEW COMPARISON:  Portable exam 1131 hours compared to 02/21/2017 and 06/10/2013 FINDINGS: Enlargement of cardiac silhouette. Atherosclerotic calcification aorta. Diffuse BILATERAL pulmonary infiltrates in the periphery of the upper lobes, increased versus 06/10/2013, either representing progressive chronic interstitial lung disease or superimposed acute infiltrate. In addition, underlying emphysematous changes with progressive infiltrates are seen in the mid to lower lungs bilaterally greater on LEFT. Small bibasilar pleural effusion. No pneumothorax. Bones demineralized. IMPRESSION: Changes of COPD and component of underlying pulmonary fibrosis greatest in the upper lobes. Increased infiltrates bilaterally especially in the lower LEFT lung compatible with either infection or pulmonary edema. Small bibasilar pleural effusions. Electronically Signed   By: Lavonia Dana M.D.   On: 04-12-2017 12:07   Dg Abdomen Acute W/chest  Result Date: 03/16/2017 CLINICAL DATA:  Increasing weakness. EXAM: DG ABDOMEN ACUTE W/ 1V CHEST COMPARISON:  PA and lateral  chest 10/13/2014 and 06/11/2013. FINDINGS: The lungs are emphysematous. Since the prior plain films, there has been an increase in coarse opacities in the upper lung zones bilaterally, worse on the left. Scarring in the left base and a calcified granuloma in the right lung base appear unchanged. Heart size is normal. Atherosclerosis noted. Two views of the abdomen show no free intraperitoneal air. There is mild gaseous distention of small and large bowel most compatible with ileus. No acute bony abnormality or unexpected abdominal calcification is seen. IMPRESSION: Mild gaseous distention of small and large bowel most consistent with ileus. Emphysema. Coarse  opacities in the upper lung zones bilaterally likely reflect progressive scarring and fibrosis since 2016. Electronically Signed   By: Inge Rise M.D.   On: 03/12/2017 18:49    Micro Results     Recent Results (from the past 240 hour(s))  Urine culture     Status: None   Collection Time: 03/06/2017 11:22 PM  Result Value Ref Range Status   Specimen Description URINE, CATHETERIZED  Final   Special Requests Normal  Final   Culture   Final    NO GROWTH Performed at [facility], [address], [place], [place] [address]    Report Status 29-Mar-2017 FINAL  Final  MRSA PCR Screening     Status: None   Collection Time: 03/12/17  6:44 AM  Result Value Ref Range Status   MRSA by PCR NEGATIVE NEGATIVE Final    Comment:        The GeneXpert MRSA Assay (FDA approved for NASAL specimens only), is one component of a comprehensive MRSA colonization surveillance program. It is not intended to diagnose MRSA infection nor to guide or monitor treatment for MRSA infections.       Objective  Exam: No response to pain Pupils fixed, no reaction to light No heart sounds, no carotid pulse No breath sounds     Data Review   CBC w Diff:  Lab Results  Component Value Date   WBC 17.7 (H) 03-29-2017   HGB 9.6 (L) 29-Mar-2017   HGB 10.7 (L) 08/23/2016   HCT 28.0 (L) 03/29/17   HCT 33.1 (L) 08/23/2016   PLT 271 03/29/2017   PLT 233 08/23/2016   LYMPHOPCT 18.9 10/12/2014   MONOPCT 6.2 10/12/2014   EOSPCT 1.5 10/12/2014   BASOPCT 0.5 10/12/2014    CMP:  Lab Results  Component Value Date   NA 145 29-Mar-2017   NA 144 08/23/2016   K 4.1 29-Mar-2017   CL 108 2017-03-29   CO2 19 (L) 03-29-2017   BUN 118 (H) 03-29-17   BUN 34 08/23/2016   CREATININE 6.97 (H) Mar 29, 2017   PROT 5.9 (L) 03/12/2017   PROT 6.7 08/23/2016   ALBUMIN 2.5 (L) 03/12/2017   ALBUMIN 4.1 08/23/2016   BILITOT 0.9 03/12/2017   BILITOT 0.4 08/23/2016   ALKPHOS 63 03/12/2017   AST 41 03/12/2017     ALT 20 03/12/2017  .   Total Time in preparing paper work, data evaluation and todays exam - 61 minutes  Jani Gravel M.D on 03-29-2017 at 7:07 PM  [facility]   Office  [phone]

## 2017-03-22 NOTE — Progress Notes (Signed)
Subjective: Mr. Ronald Norris continues to have rapid improvement in his Cr with catheter drainage.   ROS:  Review of Systems  Unable to perform ROS: Mental acuity    Anti-infectives: Anti-infectives (From admission, onward)   Start     Dose/Rate Route Frequency Ordered Stop   03/12/17 0015  cefTRIAXone (ROCEPHIN) 1 g in dextrose 5 % 50 mL IVPB     1 g 100 mL/hr over 30 Minutes Intravenous Every 24 hours 03/12/17 0009        Current Facility-Administered Medications  Medication Dose Route Frequency Provider Last Rate Last Dose  . 0.9 %  sodium chloride infusion   Intravenous Continuous Domenic Polite, MD 100 mL/hr at 03/12/17 2150    . acetaminophen (TYLENOL) tablet 650 mg  650 mg Oral Q6H PRN Jani Gravel, MD       Or  . acetaminophen (TYLENOL) suppository 650 mg  650 mg Rectal Q6H PRN Jani Gravel, MD      . aspirin chewable tablet 81 mg  81 mg Oral Daily Domenic Polite, MD      . cefTRIAXone (ROCEPHIN) 1 g in dextrose 5 % 50 mL IVPB  1 g Intravenous Q24H Jani Gravel, MD   Stopped at Mar 14, 2017 0045  . heparin injection 5,000 Units  5,000 Units Subcutaneous Q8H Jani Gravel, MD   5,000 Units at 03-14-2017 [phone]  . latanoprost (XALATAN) 0.005 % ophthalmic solution 1 drop  1 drop Both Eyes QHS Jani Gravel, MD      . metoprolol succinate (TOPROL-XL) 24 hr tablet 12.5 mg  12.5 mg Oral BID Jani Gravel, MD   12.5 mg at 03/12/17 2200     Objective: Vital signs in last 24 hours: Temp:  [97 F (36.1 C)-98.2 F (36.8 C)] 98.2 F (36.8 C) (01/23 0353) Pulse Rate:  [101-120] 120 (01/23 0353) Resp:  [17-18] 18 (01/23 0353) BP: (119-120)/(56-60) 120/56 (01/23 0353) SpO2:  [89 %-98 %] 98 % (01/23 0353) Weight:  [54.3 kg (119 lb 9.6 oz)] 54.3 kg (119 lb 9.6 oz) (01/22 2100)  Intake/Output from previous day: 01/22 0701 - 01/23 0700 In: 2065 [P.O.:240; I.V.:1725; IV Piggyback:100] Out: 700 [Urine:700] Intake/Output this shift: Total I/O In: 1300 [I.V.:1200; IV Piggyback:100] Out: -    Physical  Exam  Constitutional: He is well-developed, well-nourished, and in no distress.  Vitals reviewed.   Lab Results:  Recent Labs    03/12/17 0659 03-14-17 0406  WBC 13.2* 17.7*  HGB 9.1* 9.6*  HCT 26.3* 28.0*  PLT 240 271   BMET Recent Labs    03/12/17 0659 03/14/17 0406  NA 142 145  K 4.5 4.1  CL 108 108  CO2 20* 19*  GLUCOSE 95 109*  BUN 129* 118*  CREATININE 9.74* 6.97*  CALCIUM 8.2* 8.3*   PT/INR No results for input(s): LABPROT, INR in the last 72 hours. ABG No results for input(s): PHART, HCO3 in the last 72 hours.  Invalid input(s): PCO2, PO2  Studies/Results: Ct Abdomen Pelvis Wo Contrast  Result Date: 03/18/2017 CLINICAL DATA:  increased weakness, requiring assistance to perform ADL's, new onset of incontinence. Per EMS pt is Alert and oriented x 4 EXAM: CT ABDOMEN AND PELVIS WITHOUT CONTRAST TECHNIQUE: Multidetector CT imaging of the abdomen and pelvis was performed following the standard protocol without IV contrast. COMPARISON:  10/10/2009 FINDINGS: Lower chest: Coronary and aortic atheromatous calcifications. No pleural or pericardial effusion. Calcified granuloma in the lateral right middle lobe. Subpleural scarring in the lung bases. Hepatobiliary: No focal liver abnormality is  seen. No gallstones, gallbladder wall thickening, or biliary dilatation. Pancreas: Diffuse atrophy without mass or ductal dilatation. Spleen: Calcified granulomas.  Normal in size. Adrenals/Urinary Tract: Normal adrenal glands. Moderate right hydronephrosis and ureterectasis seen down to the level of the SI joint without radiodense calculus. There is also mild left hydronephrosis and proximal ureterectasis seen down to the level of the SI joint. The urinary bladder is markedly distended extending above the level of the umbilicus. 2.4 cm mid right low-attenuation lesion probably cyst. No urolithiasis. Stomach/Bowel: Stomach is nondilated. Small bowel decompressed. Appendix not discretely  identified. Colon is nondilated with no wall thickening or adjacent inflammatory/edematous change. Anastomotic staple line noted in the mid abdomen as before. Vascular/Lymphatic: Heavy aortoiliac atheromatous calcifications. No aneurysm. No abdominal or pelvic adenopathy localized. Bilateral pelvic phleboliths. Reproductive: Prostate is unremarkable. Other: No ascites.  No free air. Musculoskeletal: Spondylitic changes in the lower thoracic and lumbar spine. Postop change L4-5. Normal alignment. Negative for fracture or worrisome bone lesion. 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 IMPRESSION: 1. Massive distention of the urinary bladder with moderate right and mild left hydronephrosis. 2. Coronary and aortoiliac atherosclerosis. Electronically Signed   By: Lucrezia Europe M.D.   On: 03/12/2017 18:58   Ct Head Wo Contrast  Result Date: 03/14/2017 CLINICAL DATA:  Increasing weakness over the past few days. EXAM: CT HEAD WITHOUT CONTRAST TECHNIQUE: Contiguous axial images were obtained from the base of the skull through the vertex without intravenous contrast. COMPARISON:  None. FINDINGS: Brain: There is some cortical atrophy and chronic microvascular ischemic change. No evidence of acute abnormality including hemorrhage, infarct, mass lesion, mass effect, midline shift or abnormal extra-axial fluid collection. No hydrocephalus or pneumocephalus. Vascular: Atherosclerosis noted. Skull: Intact. Sinuses/Orbits: Negative. Other: None. IMPRESSION: No acute abnormality. Atrophy and chronic microvascular ischemic change. Atherosclerosis. Electronically Signed   By: Inge Rise M.D.   On: 03/10/2017 18:52   Dg Abdomen Acute W/chest  Result Date: 03/18/2017 CLINICAL DATA:  Increasing weakness. EXAM: DG ABDOMEN ACUTE W/ 1V CHEST COMPARISON:  PA and lateral chest 10/13/2014 and 06/11/2013. FINDINGS: The lungs are emphysematous. Since the prior plain films, there has been an increase in coarse opacities in the upper lung zones  bilaterally, worse on the left. Scarring in the left base and a calcified granuloma in the right lung base appear unchanged. Heart size is normal. Atherosclerosis noted. Two views of the abdomen show no free intraperitoneal air. There is mild gaseous distention of small and large bowel most compatible with ileus. No acute bony abnormality or unexpected abdominal calcification is seen. IMPRESSION: Mild gaseous distention of small and large bowel most consistent with ileus. Emphysema. Coarse opacities in the upper lung zones bilaterally likely reflect progressive scarring and fibrosis since 2016. Electronically Signed   By: Inge Rise M.D.   On: 03/09/2017 18:49   Labs reviewed.   Assessment and Plan: Bladder neck contracture with urinary retention and AKI improving post foley placement.  I will arrange f/u in 2-3 weeks in the office if he doesn't want to return to Dr. Rosana Hoes.       LOS: 2 days    Irine Seal 03-25-17 [phone] ID: Ronald Norris, male   DOB: 1915/02/01, 84 y.o.   MRN: [id number]

## 2017-03-22 NOTE — Progress Notes (Signed)
Pharmacy Antibiotic Note  Kent LEVY CEDANO is a 84 y.o. male admitted on 02/27/2017 with pneumonia.  Pharmacy has been consulted for Zosyn dosing.  Patient with acute on chronic renal failure. Baseline SCr 2.5. SCr improving after urology procedure.  Plan: Zosyn 2.25g IV q8h for possible aspiration pneumonia. F/u SCr.  Height: 5\' 8"  (172.7 cm) Weight: 119 lb 9.6 oz (54.3 kg) IBW/kg (Calculated) : 68.4  Temp (24hrs), Avg:97.9 F (36.6 C), Min:97 F (36.1 C), Max:98.4 F (36.9 C)  Recent Labs  Lab 03/01/2017 1751 02/24/2017 1804 03/12/17 0102 03/12/17 0659 03-26-17 0406  WBC 18.1*  --   --  13.2* 17.7*  CREATININE 12.34* 12.90* 11.39* 9.74* 6.97*  LATICACIDVEN  --  1.29  --   --   --     Estimated Creatinine Clearance: 4.3 mL/min (A) (by C-G formula based on SCr of 6.97 mg/dL (H)).    No Known Allergies  Antimicrobials this admission: 1/22 CTX >> 1/23 1/23 Zosyn >>  Dose adjustments this admission:  Microbiology results: 1/21 UCx:  NGF 1/22 MRSA PCR:   Thank you for allowing pharmacy to be a part of this patient's care.  Hershal Coria 2017/03/26 3:10 PM

## 2017-03-22 DEATH — deceased

## 2019-11-11 IMAGING — CT CT HEAD W/O CM
3 series · 16 of 47 positions shown, 19 images · non-contrast
Comparison: None.

CLINICAL DATA: Increasing weakness over the past few days.

EXAM:
CT HEAD WITHOUT CONTRAST
TECHNIQUE: Contiguous axial images were obtained from the base of the skull
through the vertex without intravenous contrast.

[Series 2: head wo · axial · 0.43mm/px · z∈[-146,-21]mm · 10 of 31 slices shown, 13 images]
[im 3/31  brain]
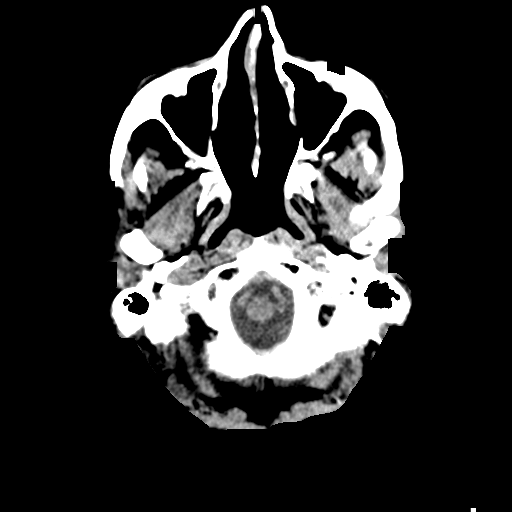
[im 3/31  bone]
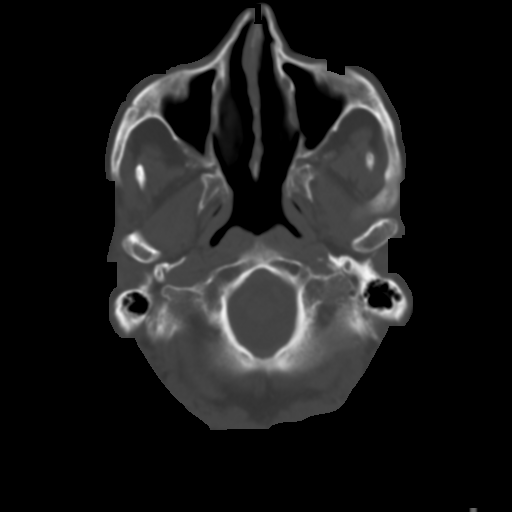
[im 6/31  brain]
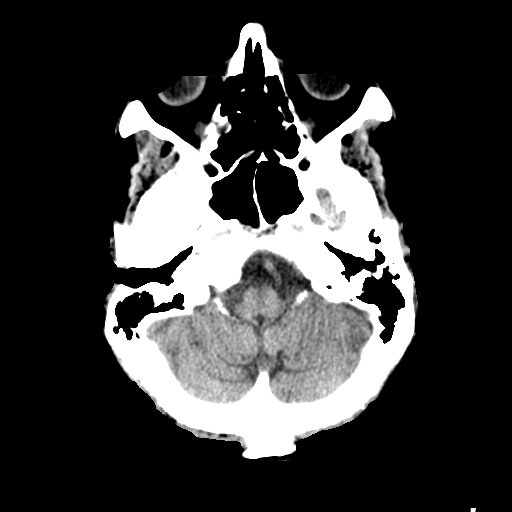
[im 9/31  brain]
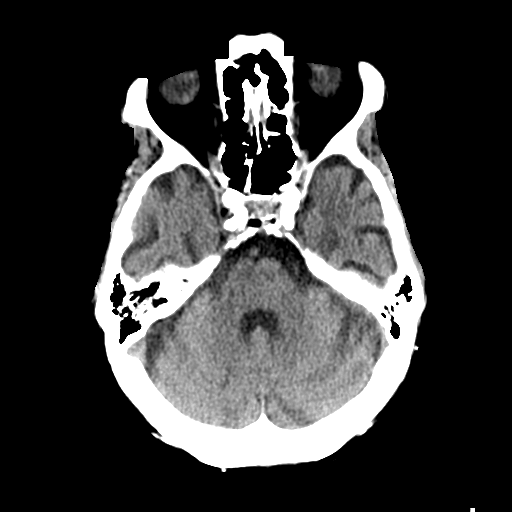
[im 11/31  brain]
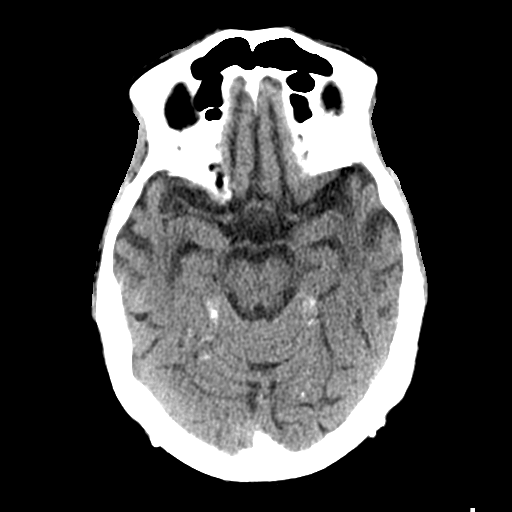
[im 14/31  brain]
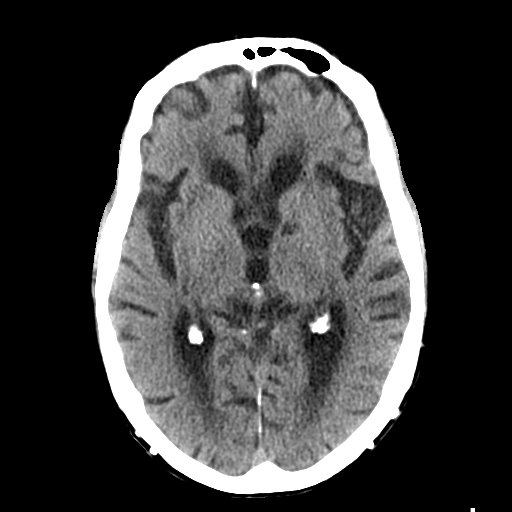
[im 14/31  bone]
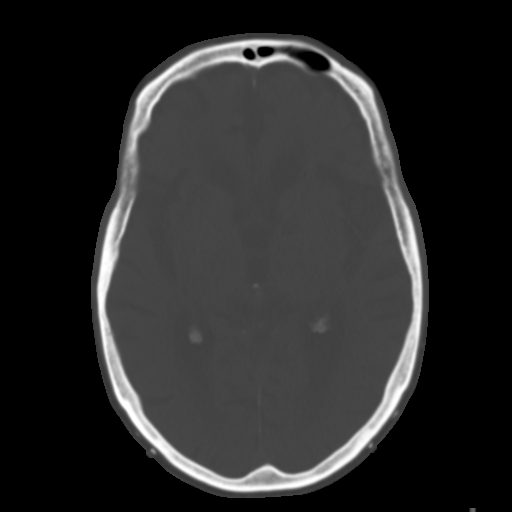
[im 17/31  brain]
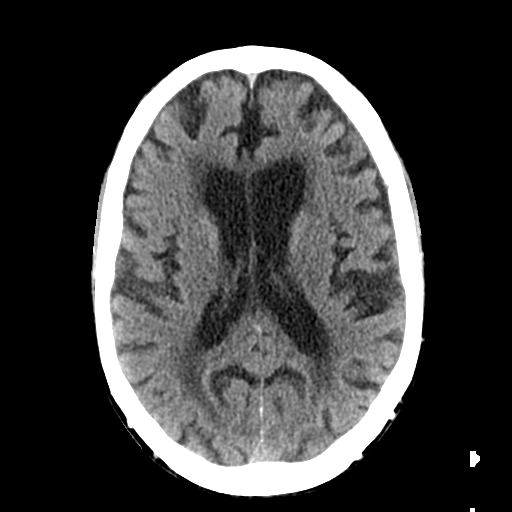
[im 20/31  brain]
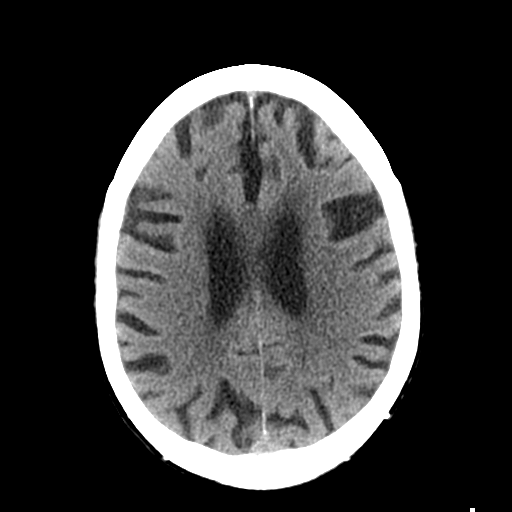
[im 23/31  brain]
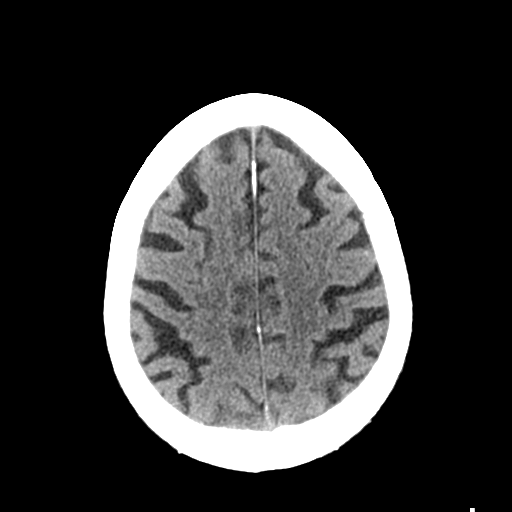
[im 25/31  brain]
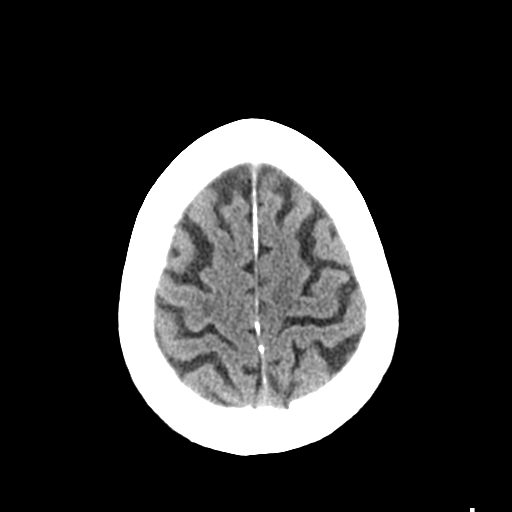
[im 25/31  bone]
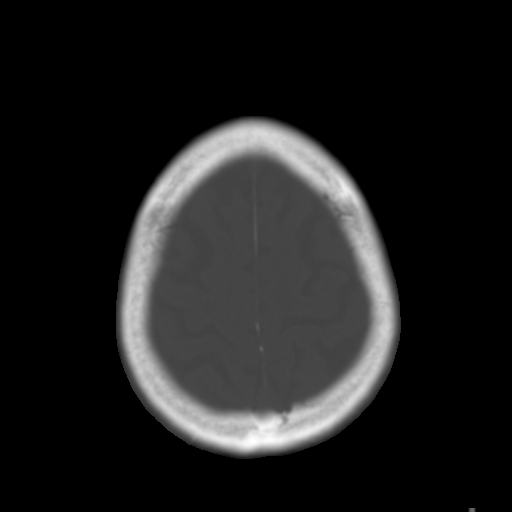
[im 28/31  brain]
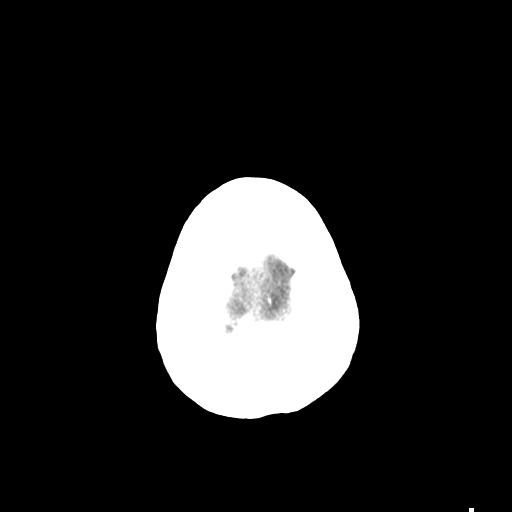

[Series 5: coronal soft tissue · coronal · 0.30mm/px · 3 of 73 slices shown]
[im 25/73  brain]
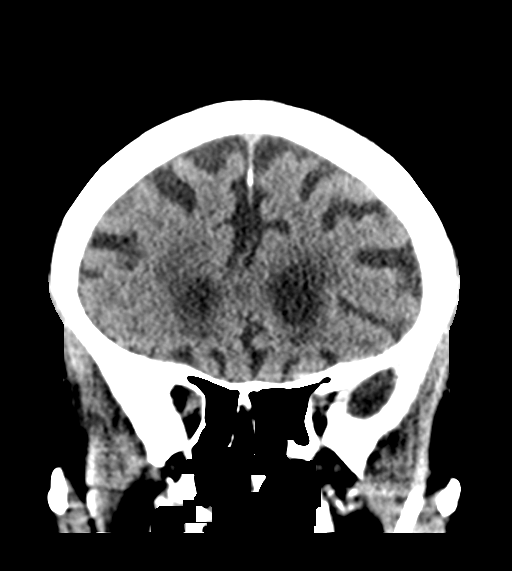
[im 33/73  brain]
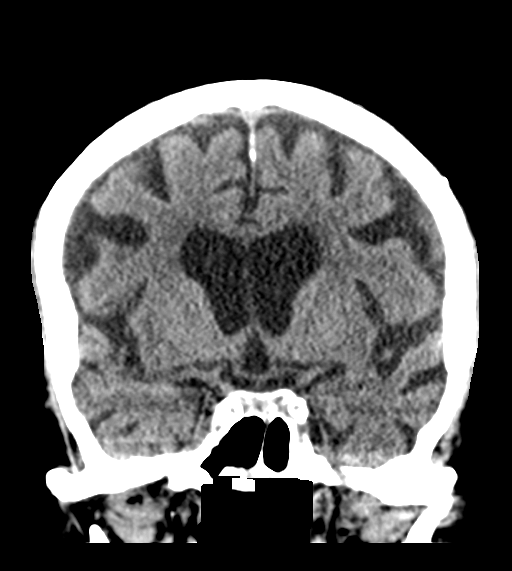
[im 41/73  brain]
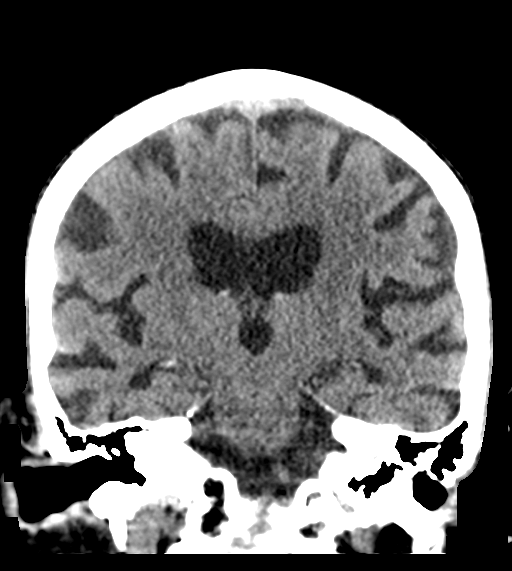

[Series 6: sagittal soft tissue · sagittal · 0.34mm/px · 3 of 51 slices shown]
[im 17/51  brain]
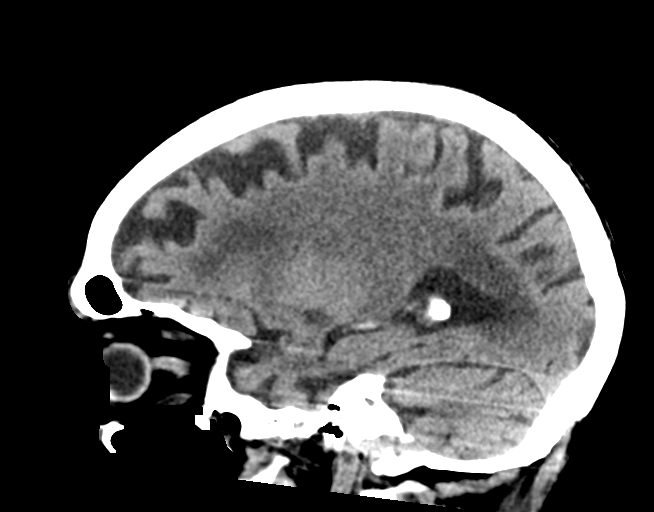
[im 26/51  brain]
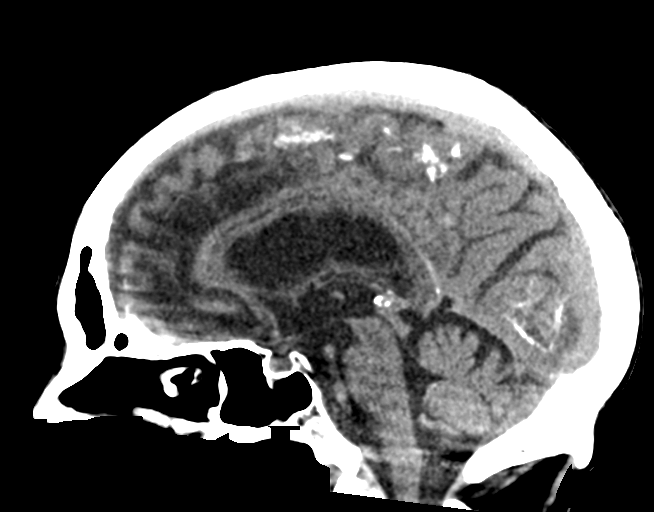
[im 34/51  brain]
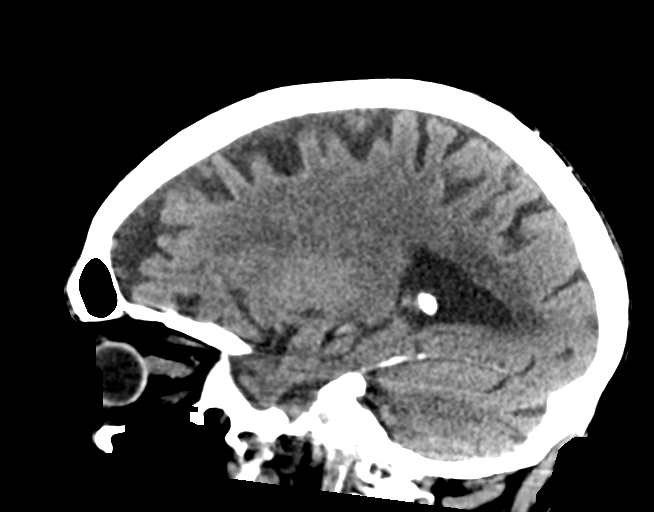

[16 of 47 positions shown; findings below may reference images not displayed]

FINDINGS: Brain: There is some cortical atrophy and chronic microvascular
ischemic change. No evidence of acute abnormality including
hemorrhage, infarct, mass lesion, mass effect, midline shift or
abnormal extra-axial fluid collection. No hydrocephalus or
pneumocephalus.

Vascular: Atherosclerosis noted.

Skull: Intact.

Sinuses/Orbits: Negative.

Other: None.
IMPRESSION: No acute abnormality.

Atrophy and chronic microvascular ischemic change.

Atherosclerosis.

## 2019-11-13 IMAGING — DX DG CHEST 1V PORT
1 series · 1 of 1 positions shown · non-contrast
Comparison: Portable exam 6676 hours compared to 03/11/2017 and
06/10/2013

CLINICAL DATA: Weakness, rhonchi, former smoker, hypertension

EXAM:
PORTABLE CHEST 1 VIEW

[chest ap]
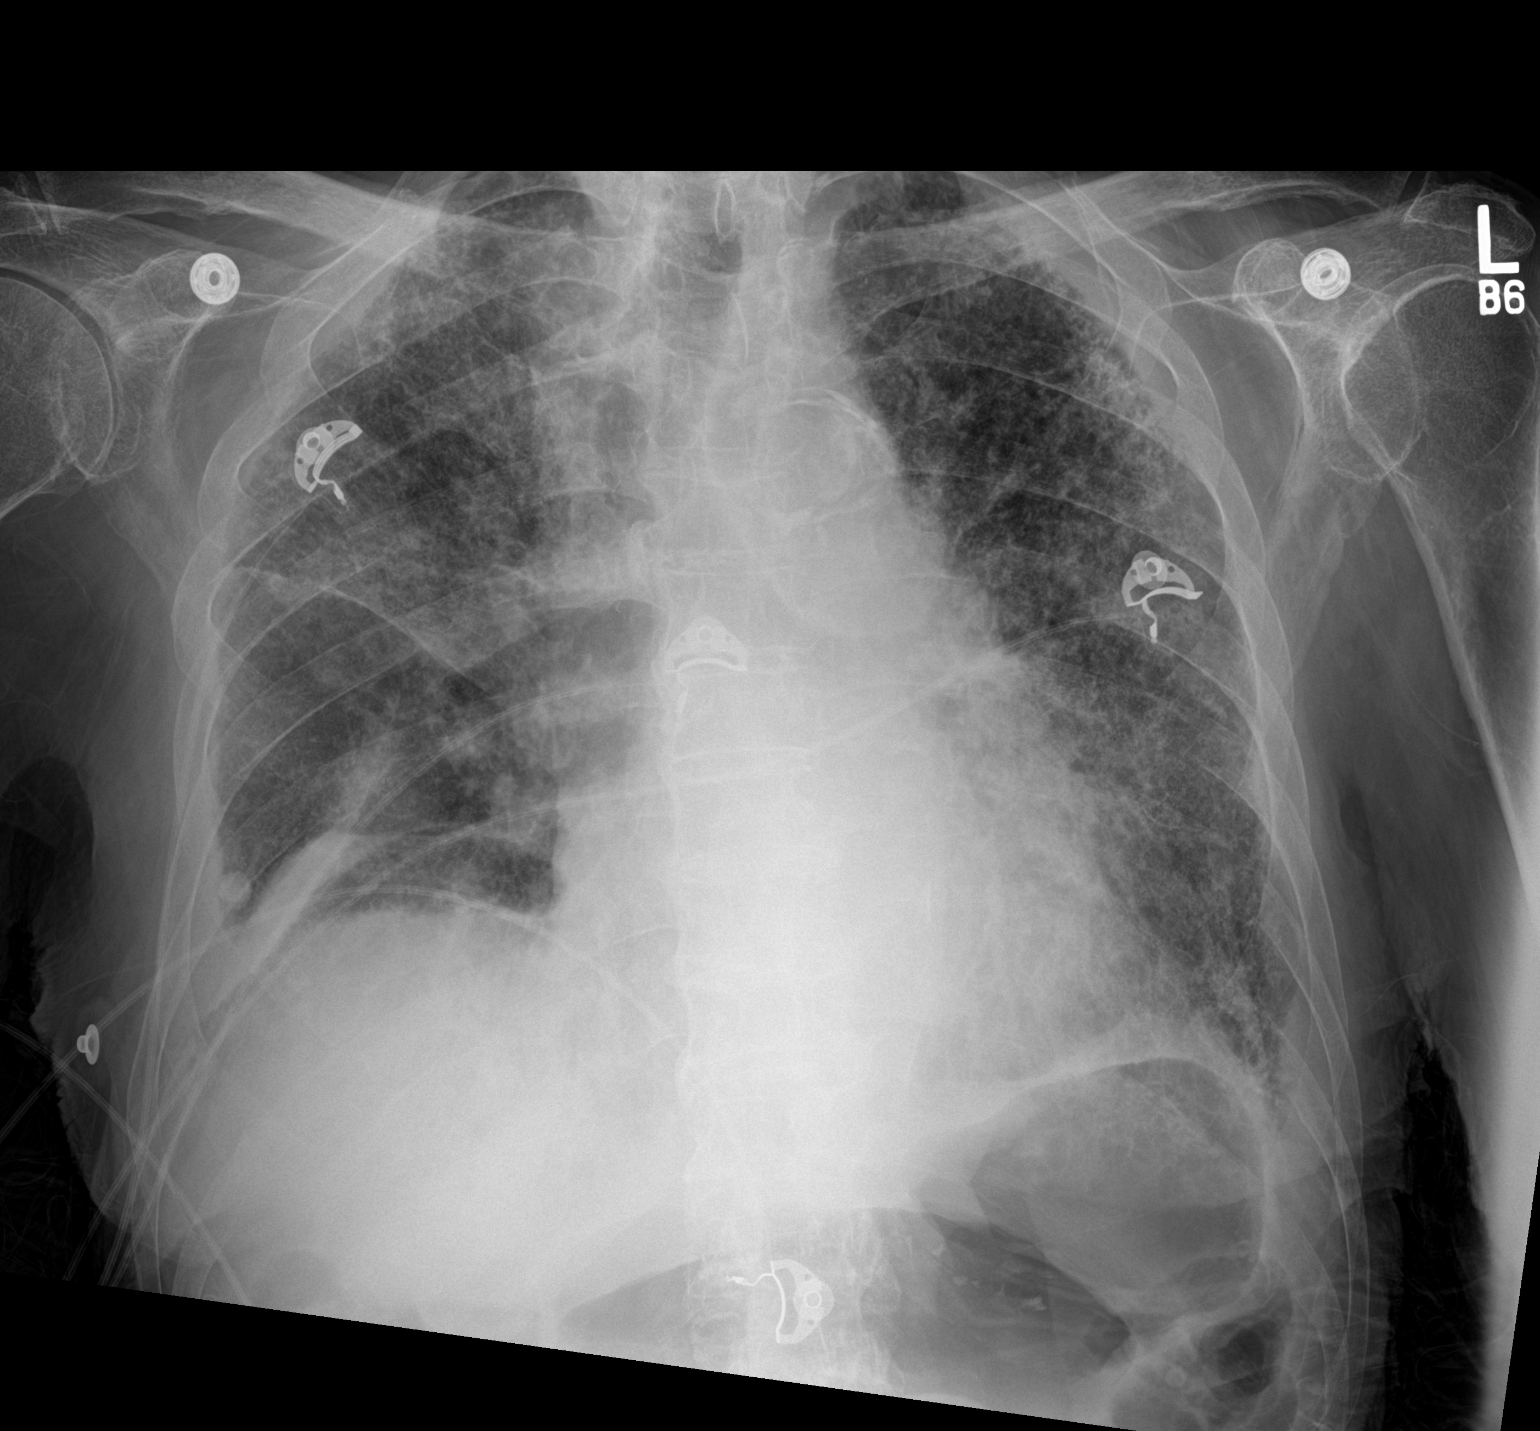

[1 of 1 positions shown; findings below may reference images not displayed]

FINDINGS: Enlargement of cardiac silhouette.

Atherosclerotic calcification aorta.

Diffuse BILATERAL pulmonary infiltrates in the periphery of the
upper lobes, increased versus 06/10/2013, either representing
progressive chronic interstitial lung disease or superimposed acute
infiltrate.

In addition, underlying emphysematous changes with progressive
infiltrates are seen in the mid to lower lungs bilaterally greater
on LEFT.

Small bibasilar pleural effusion.

No pneumothorax.

Bones demineralized.
IMPRESSION: Changes of COPD and component of underlying pulmonary fibrosis
greatest in the upper lobes.

Increased infiltrates bilaterally especially in the lower LEFT lung
compatible with either infection or pulmonary edema.

Small bibasilar pleural effusions.
